# Patient Record
Sex: Male | Born: 1985 | Race: White | Hispanic: No
Health system: Midwestern US, Community
[De-identification: ages and names within clinical notes are randomized; demographics above are authoritative.]

## PROBLEM LIST (undated history)

## (undated) DIAGNOSIS — S069XAA Unspecified intracranial injury with loss of consciousness status unknown, initial encounter: Secondary | ICD-10-CM

## (undated) DIAGNOSIS — G43909 Migraine, unspecified, not intractable, without status migrainosus: Secondary | ICD-10-CM

## (undated) DIAGNOSIS — S069X9A Unspecified intracranial injury with loss of consciousness of unspecified duration, initial encounter: Secondary | ICD-10-CM

## (undated) HISTORY — PX: ANKLE SURGERY: SHX546

---

## 2014-11-05 ENCOUNTER — Inpatient Hospital Stay
Admit: 2014-11-05 | Discharge: 2014-11-05 | Disposition: A | Discharge status: Home / Self Care | Payer: PRIVATE HEALTH INSURANCE | Attending: Emergency Medical Services

## 2014-11-05 DIAGNOSIS — L500 Allergic urticaria: Secondary | ICD-10-CM

## 2014-11-05 LAB — POC CHEM8
BUN: 14 mg/dl (ref 7–25)
BUN: 15 mg/dl (ref 7–25)
CALCIUM,IONIZED: 4.3 mg/dL — ABNORMAL LOW (ref 4.40–5.40)
CALCIUM,IONIZED: 4.3 mg/dL — ABNORMAL LOW (ref 4.40–5.40)
CO2, TOTAL: 19 mmol/L — ABNORMAL LOW (ref 21–32)
CO2, TOTAL: 20 mmol/L — ABNORMAL LOW (ref 21–32)
Chloride: 108 mEq/L — ABNORMAL HIGH (ref 98–107)
Chloride: 109 mEq/L — ABNORMAL HIGH (ref 98–107)
Creatinine: 0.8 mg/dl (ref 0.6–1.3)
Creatinine: 0.9 mg/dl (ref 0.6–1.3)
Glucose: 92 mg/dL (ref 74–106)
Glucose: 89 mg/dL (ref 74–106)
HCT: 35 % — ABNORMAL LOW (ref 40–54)
HCT: 35 % — ABNORMAL LOW (ref 40–54)
HGB: 11.9 gm/dl — ABNORMAL LOW (ref 13.0–17.2)
HGB: 11.9 gm/dl — ABNORMAL LOW (ref 13.0–17.2)
Potassium: 2.9 mEq/L — CL (ref 3.5–4.9)
Potassium: 3 mEq/L — ABNORMAL LOW (ref 3.5–4.9)
Sodium: 145 mEq/L (ref 136–145)
Sodium: 144 mEq/L (ref 136–145)

## 2014-11-05 MED ORDER — POTASSIUM CHLORIDE SR 10 MEQ TAB
10 mEq | ORAL | Status: AC
Start: 2014-11-05 — End: 2014-11-05
  Administered 2014-11-05: 06:00:00 via ORAL

## 2014-11-05 MED FILL — POTASSIUM CHLORIDE SR 10 MEQ TAB: 10 mEq | ORAL | Qty: 4

## 2014-11-05 NOTE — ED Notes (Signed)
Ate dinner at 1730 cheeseburger, oinion rings . Three hours after meal developed abdominal pain then developed hives on back, chest and legs. Given benadryl 50 mg IVP PTA with relief of symptoms.

## 2014-11-05 NOTE — Other (Signed)
2:38 AM  11/05/2014     Discharge instructions given to pt (name) with verbalization of understanding. Patient accompanied by friend.  Patient discharged with the following prescriptions none. Patient discharged to home (destination).      Bynum Bellows, RN

## 2014-11-06 NOTE — ED Provider Notes (Signed)
[facility]  EMERGENCY DEPARTMENT TREATMENT REPORT  NAME:  John Dickson  SEX:   M  ADMIT: 11/05/2014  DOB:   1985/06/19  MR#    [id number]  ROOM:  IB70  TIME DICTATED: 04 28 AM  ACCT#  [id number]        TIME OF EVALUATION:  0147.    CHIEF COMPLAINT:  Allergic reaction.    HISTORY OF PRESENT ILLNESS:  A 29 year old male who presents for allergic reaction.  Approximately 2330 he   suddenly felt very warm with cramping, generalized abdominal pain, generalized   itching with hives to his face, chest and back.  Initially thought symptoms   were an allergic reaction to a cheeseburger and onion rings he had eaten at a   new restaurant, [employer] around 1730 today,  but then he recalled   approximately 30 minutes prior to onset of symptoms, he had eaten [employer] which he had also never eaten.  He works as a Surveyor, minerals living on a   compound where there [employer]or, minerals.  He was given 50 mg Benadryl IV prior to   arrival and presents with an IV in place and a liter of fluids running.    Symptoms have already have completely resolved on arrival.  States he feels   great and wants to be discharged.    REVIEW OF SYSTEMS:  ENT:  No swelling of lips or tongue.  No throat pain, tightening swelling or   discomfort.  RESPIRATORY:  No shortness of breath or difficulty breathing, cough or   wheezing.  ALLERGIC:  No urticaria.  GASTROINTESTINAL:  As above.  No nausea, vomiting or diarrhea.    PAST MEDICAL HISTORY:  Left ankle fracture.    SOCIAL HISTORY:  Former smoker, still dips tobacco, consumes alcohol socially.  Denies   recreational drug use.    ALLERGIES:  PENICILLINS.    MEDICATIONS:  Advil p.r.n.    PHYSICAL EXAMINATION:  VITAL SIGNS:  BP 126/46, pulse 62, respirations 16, temperature 97.5, pain 0,   O2 saturation 98% on room air.  GENERAL APPEARANCE:  Well developed, well nourished, seated comfortably on the   bed.  HEENT:  Eyes:  Conjunctivae clear, lids normal.  Pupils equal, symmetrical,    and normally reactive.  ENT:  No swelling of lips or tongue.  Oral mucosa is pink and moist.  Uvula   midline, nonedematous, nonerythematous.  Posterior pharynx has no swelling.    Airway patent, no stridor.  NECK:  Supple, nontender, no swelling or submandibular masses.  Trachea   midline.  RESPIRATORY:  Lungs clear.  No wheezing, crackles, rales or rhonchi.  No   respiratory distress, tachypnea or accessory muscle use.  CARDIOVASCULAR:  Heart regular, without murmurs, gallops, rubs or thrills.  GASTROINTESTINAL:   Abdomen is soft, nondistended, nontender.  SKIN:  Warm and dry.  There is no rash or urticaria to his person.    DIAGNOSTIC INTERPRETATIONS:  Initial Chem 8:  Potassium 2.9, chloride 108, CO2 total 19, hematocrit 35,   hemoglobin 11.9, ionized calcium 4.3.  Repeat Chem 8:  Potassium 3, chloride   109, total CO2 of 20, hematocrit 35, hemoglobin 11.9, ionized calcium 4.3.    COURSE IN THE EMERGENCY DEPARTMENT:  The patient presents following an allergic reaction.  I do suspect it was the   [employer] that caused his symptoms.   He has had a complete resolution of   the symptoms with the Benadryl he was  given prior to arrival.  He has   remained asymptomatic throughout his ED stay.  No signs or symptoms concerning   for her anaphylaxis.    Prior to my evaluation, the nurse had check an i-STAT chem 8.  Potassium was   low at 2.9, so she repeated another chem 8 to repeat the potassium and it was   still low at 3.0.  Because of this, we did supplement him with 40 mEq of   Klor-Con 10 p.o.  Neither I nor Dr. Cipriano Mile would have ordered a Chem 8 and   both of these were done by the nurse.  Chem 8 also showed that his CO2 was low   which is consistent with dehydration.  The patient admitted he had been   drinking alcohol socially  the past few days.  Therefore, we did hydrate with   a liter of fluids during his ED stay and recommended he encourage fluids at   home.    DIAGNOSES:   1.  Allergic reaction, initial encounter.   2.  Hypokalemia.    DISPOSITION:  The patient discharged home in stable condition with verbal and written   instructions for ongoing care.  He was instructed to avoid Was[employer] popcorn in   the future.  Should encourage fluids at home.  May continue Benadryl 25 to 50   mg every 6 hours as needed for itching or should rash return.  He is aware he   may return to the emergency department at any time for new or worsening   symptoms and should return immediately should he develop difficulty breathing,   shortness of breath, swelling of lips or tongue or any other concerns.      The patient was personally evaluated by myself and Dr. Damien Fusi who   agrees with the above assessment and plan.      ___________________  Elsie Saas MD  Dictated By: Verlin Grills, PA    My signature above authenticates this document and my orders, the final   diagnosis (es), discharge prescription (s), and instructions in the Epic   record.  If you have any questions please contact [phone].    Nursing notes have been reviewed by the physician/ advanced practice   clinician.    BB  D:11/06/2014 04:28:56  T: 11/06/2014 16:04:55  [id number]

## 2016-04-25 ENCOUNTER — Ambulatory Visit: Payer: Self-pay | Admitting: Family Medicine

## 2016-04-26 NOTE — ED Provider Notes (Signed)
 ------------------------------------------------------------------------------- Attestation signed by Odean ONEIDA Hands, MD at 04/28/16 [phone] I have reviewed and agree with the APP's findings and plan for this patient. Odean ONEIDA Hands, MD Emergency Department - 04/28/2016 12:37 AM  -------------------------------------------------------------------------------  [facility]  ED Provider Note  Zachary Eaton 30 y.o. male DOB: 03/01/1986 MRN: [id number] History   Chief Complaint  Patient presents with  . Psychiatric Evaluation    Pt was brought here by [facility] d/t Involuntary Committment - Pt was in [facility] - states  end is coming - according to [facility]   According to the [place] police officer with the patient he was contacted by a doctor at facility who said that he was taking out IVC papers on the patient. The patient has been quoting the Bible and is certain that the end of time is coming. The patient states that this is correct it does feel the end of time is coming. He says that he has seen this in his dreams at night. The patient states he does not hear voices and does not see things that other people don't see. The patient states increased not depressed and has no thoughts of hurting himself or others. Patient states he smokes marijuana intermittently and drinks occasionally but does not use any other drugs and has not had anything to drink today. The patient states that he has never been diagnosed with depression or any other mental disorder and is not currently taking any medications.     History provided by:  Patient Psychiatric Evaluation  Presenting symptoms: delusional and paranoid behavior   Presenting symptoms: no depression, no hallucinations, no homicidal ideas and no suicidal thoughts   Patient accompanied by:  Law enforcement Degree of incapacity (severity):  Moderate Onset quality: The patient states he stopped this way for a long  time. Duration: Unknown. Timing:  Constant Progression:  Unchanged Chronicity:  New Context: alcohol use and drug abuse (marijuana currently and the patient states he tried methamphetamine once a long time ago)   Context: not noncompliant and not stressful life event (the patient denies)   Relieved by:  None tried Worsened by:  Nothing tried Associated symptoms: no abdominal pain, no anxiety, no chest pain, no headaches, no hyperventilation and no irritability   Risk factors: no hx of mental illness (the patient deies) and no hx of suicide attempts     History reviewed. No pertinent past medical history.  Past Surgical History:  Procedure Laterality Date  . Ankle surgery     left    History  Alcohol Use No   History  Smoking Status  . Never Smoker  Smokeless Tobacco  . Current User   History  Drug Use  . Types: Marijuana        No Known Allergies  Home Medications   No medications on file    Review of Systems   Review of Systems  Constitutional: Negative for fever and irritability.  Respiratory: Negative for shortness of breath.   Cardiovascular: Negative for chest pain.  Gastrointestinal: Negative for abdominal pain, nausea and vomiting.  Musculoskeletal: Negative for back pain and neck pain.  Skin: Negative for color change.  Neurological: Negative for headaches.  Psychiatric/Behavioral: Positive for paranoia. Negative for hallucinations, homicidal ideas and suicidal ideas. The patient is not nervous/anxious.     Physical Exam   ED Triage Vitals [04/26/16 1916]  BP (!) 164/92  Heart Rate 102  Resp 18  SpO2 99 %  Temp 98.7 F (37.1  C)    Physical Exam  Constitutional: He is oriented to person, place, and time. He appears well-developed and well-nourished.  The patient is a well-nourished well-developed 30 year old male in no acute distress. The patient is alert and oriented 3 and does not appear to be ill or toxic.  HENT:  Head: Normocephalic  and atraumatic.  Mouth/Throat: Oropharynx is clear and moist.  Eyes: EOM are normal.  Neck: Normal range of motion.  Cardiovascular: Normal rate, normal heart sounds and intact distal pulses.   Pulmonary/Chest: Effort normal and breath sounds normal. No respiratory distress. He has no wheezes. He has no rales.  Musculoskeletal: Normal range of motion.  Neurological: He is alert and oriented to person, place, and time.  Skin: Skin is warm and dry.  Psychiatric: His speech is normal. Judgment normal. His mood appears not anxious. He is not agitated, not aggressive, not hyperactive and not withdrawn. Thought content is paranoid and delusional. Cognition and memory are normal. He does not exhibit a depressed mood. He expresses no homicidal and no suicidal ideation. He expresses no suicidal plans and no homicidal plans.  Vitals reviewed.   ED Course   Lab results:    SALICYLATE LEVEL - Abnormal       Result Value   Salicylate 10.3 (*)   URINE DRUGS OF ABUSE SCRN - Abnormal    Cannab Scr Positive (*)    Ur PH DOA Scr 5.5     Amphet Scr Negative     Barb Scr Negative     Benzo Scr Negative     Cocaine Scr Negative     Opiates Scr Negative     Meth Scr Negative     Oxyco Scr Negative     Narrative:    Please Note Detection Levels Below:                            Amphetamines                    1000 ng/mL  Barbiturates                    200 ng/mL  Benzodiazepines                 200 ng/mL  Cannabinoids (Marijuana, THC)   50 ng/mL  Cocaine                         300 ng/mL  Opiates                         300 ng/mL  Methadone                       300 ng/mL  Oxycodone                       100 ng/mL  This test is a screening test and results are only to be used for medical purposes.  If confirmation of positive results are needed, please order confirmation by GC/MS for each drug that needs confirmation.  Urine specimens are retained for 5 days.  URINALYSIS WITH MICROSCOPIC  - Abnormal    Urine Ketones 40  (*)    Urine Color Yellow     Urine Appearance Clear     Urine Specific Gravity 1.007  Urine pH 5.5     Urine Protein - Dipstick Negative     Urine Glucose Negative     Urine Bilirubin Negative     Urine Blood Negative     Urine Nitrite Negative     Urine Urobilinogen 0.2      Urine Leukocyte Esterase Negative    CBC AND DIFFERENTIAL - Abnormal    WBC 14.1 (*)    NEUTROPHIL % 81.4 (*)    LYMPHOCYTE % 13.6 (*)    Eosinophil % 0.1 (*)    ABSOLUTE NEUTROPHIL COUNT 11.46 (*)    RBC 5.34     HGB 16.0     HCT 46.8     MCV 88     MCH 30.0     MCHC 34.2     Plt Ct 335     RDW SD 39.2     MPV 10.3     NRBC% 0.0     NRBC 0.000     MONOCYTE % 4.8     BASOPHIL % 0.1     ABSOLUTE LYMPHOCYTE COUNT 1.9     MONO ABSOLUTE 0.7     EOS ABSOLUTE 0.0     BASO ABSOLUTE 0.0    COMPREHENSIVE METABOLIC PANEL - Abnormal    Cl 96 (*)    Glucose 106 (*)    BUN/CREAT RATIO 7.8 (*)    AGAP 17 (*)    Na 137     Potassium 4.0     CO2 24     BUN 7     Creatinine 0.90     Ca 9.7     ALK PHOS 41     T Bili 0.84     Total Protein 8.5     Alb 5.5     GLOBULIN 3.0     ALBUMIN/GLOBULIN RATIO 1.8     ALT 20     AST 27     GFR AFRICAN AMERICAN 134     Comment:  African-American:  Normal GFR (glomerular filtration rate) > 60 mL/min/1.73 meters squared. < 60 may include impaired kidney function based on creatinine, age, gender, and race normalized to accepted average body surface area    GFR Non African American 116     Comment:  Non African American:  Normal GFR (glomerular filtration rate) > 60 mL/min/1.73 meters squared. < 60 may include impaired kidney function based on creatinine, age, gender, and race normalized to accepted average body surface area.   ACETAMINOPHEN  LEVEL - Normal   Acetaminophen  <15.0    ETHANOL - Normal   Ethanol <10     Comment: Blood Alcohol Level is for Medical Purposes Only.    Imaging: No data to display ECG: ECG Results    None     Pre-Sedation Procedures  ED Course    MDM Number of Diagnoses or Management Options Delusional disorder (*):  Marijuana abuse:  Paranoid (*):  Diagnosis management comments: Discussed behavioral health and they will evaluate the patient.    Amount and/or Complexity of Data Reviewed Clinical lab tests: ordered and reviewed   Coding  New Prescriptions   No medications on file    Clinical Impression   Final diagnoses:  Delusional disorder (*)  Paranoid (*)  Marijuana abuse    ED Disposition    ED Disposition Comment   Behavioral Health         note: Voice recognition software was used in the creation of this document. Unintended dictation in all areas  may have escaped editorial review.   Electronically signed by:   Glean Birmingham, PA 04/26/16 2156
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
## 2016-11-26 NOTE — ED Provider Notes (Signed)
-------------------------------------------------------------------------------   Attestation signed by Parnell Donnice SQUIBB, MD at 11/26/2016  6:32 PM Please see my note for remainder of care. -------------------------------------------------------------------------------  10:26 AM LACERATION REPAIR (simple) -  Time out performed and patient identified yes Site marking completed Yes The benefits of the procedure were carefully explained to the patient.  The potential risks and complications of the procedure were also discussed and the patient indicated a clear understanding.  The patient gave verbal consent without reservation.    All wound(s) areas needing sutures were prepped with hibiclens. Local anesthetic was infiltrated using LET, and the woundwas  irrigated with copious amounts of normal saline.  Laceration was 1.5cm in length, located on right forehead, simple, and full thickness(skin only) .  Wound did not require removal of particulate matter.  Exploration of wound showed no foreign bodies. The wound was closed with 2 sutures of 6-0 prolene.  There were 1 packs of sutures opened. Good wound closure and hemostasis was noted. Patient tolerated the procedure well with no known complications. A band aid was applied and the patient was given verbal and written post procedural instructions.  This was the only participation and intervention I was involved with concerning this patients care while at our facility.
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
## 2017-07-17 ENCOUNTER — Other Ambulatory Visit: Payer: Self-pay

## 2017-07-17 ENCOUNTER — Encounter (HOSPITAL_BASED_OUTPATIENT_CLINIC_OR_DEPARTMENT_OTHER): Payer: Self-pay

## 2017-07-17 ENCOUNTER — Emergency Department (HOSPITAL_BASED_OUTPATIENT_CLINIC_OR_DEPARTMENT_OTHER)
Admission: EM | Admit: 2017-07-17 | Discharge: 2017-07-17 | Disposition: A | Discharge status: Home / Self Care | Payer: Self-pay | Attending: Emergency Medicine | Admitting: Emergency Medicine

## 2017-07-17 DIAGNOSIS — R519 Headache, unspecified: Secondary | ICD-10-CM

## 2017-07-17 DIAGNOSIS — F17290 Nicotine dependence, other tobacco product, uncomplicated: Secondary | ICD-10-CM | POA: Insufficient documentation

## 2017-07-17 DIAGNOSIS — R51 Headache: Secondary | ICD-10-CM | POA: Insufficient documentation

## 2017-07-17 HISTORY — DX: Migraine, unspecified, not intractable, without status migrainosus: G43.909

## 2017-07-17 LAB — BASIC METABOLIC PANEL
ANION GAP: 8 (ref 5–15)
BUN: 11 mg/dL (ref 6–20)
CHLORIDE: 104 mmol/L (ref 101–111)
CO2: 27 mmol/L (ref 22–32)
Calcium: 9.2 mg/dL (ref 8.9–10.3)
Creatinine, Ser: 0.8 mg/dL (ref 0.61–1.24)
GFR calc Af Amer: >60 mL/min (ref >60)
GFR calc non Af Amer: >60 mL/min (ref >60)
GLUCOSE: 95 mg/dL (ref 65–99)
Potassium: 3.7 mmol/L (ref 3.5–5.1)
Sodium: 139 mmol/L (ref 135–145)

## 2017-07-17 LAB — CBC
HCT: 40.6 % (ref 39.0–52.0)
HEMOGLOBIN: 14 g/dL (ref 13.0–17.0)
MCH: 30.1 pg (ref 26.0–34.0)
MCHC: 34.5 g/dL (ref 30.0–36.0)
MCV: 87.3 fL (ref 78.0–100.0)
Platelets: 205 10*3/uL (ref 150–400)
RBC: 4.65 MIL/uL (ref 4.22–5.81)
RDW: 12.4 % (ref 11.5–15.5)
WBC: 12.6 10*3/uL — ABNORMAL HIGH (ref 4.0–10.5)

## 2017-07-17 MED ORDER — SODIUM CHLORIDE 0.9 % IV BOLUS (SEPSIS)
1000.0000 mL | Freq: Once | INTRAVENOUS | Status: AC
  Administered 2017-07-17: 1000 mL via INTRAVENOUS

## 2017-07-17 MED ORDER — PROCHLORPERAZINE EDISYLATE 5 MG/ML IJ SOLN
10.0000 mg | Freq: Once | INTRAMUSCULAR | Status: AC
  Administered 2017-07-17: 10 mg via INTRAVENOUS
  Filled 2017-07-17: qty 2

## 2017-07-17 MED ORDER — DIPHENHYDRAMINE HCL 50 MG/ML IJ SOLN
25.0000 mg | Freq: Once | INTRAMUSCULAR | Status: AC
  Administered 2017-07-17: 25 mg via INTRAVENOUS
  Filled 2017-07-17: qty 1

## 2017-07-17 MED ORDER — KETOROLAC TROMETHAMINE 15 MG/ML IJ SOLN
15.0000 mg | Freq: Once | INTRAMUSCULAR | Status: AC
  Administered 2017-07-17: 15 mg via INTRAVENOUS
  Filled 2017-07-17: qty 1

## 2017-07-17 NOTE — ED Provider Notes (Signed)
[facility] EMERGENCY DEPARTMENT Provider Note   CSN: [id number] Arrival date & time: 07/17/17  1652     History   Chief Complaint Chief Complaint  Patient presents with  . Headache    HPI Zachary Eaton is a 32 y.o. male.  HPI  Zachary Eaton is a 32 year old male with a history of migraines who presents to the emergency department for evaluation of migraine headache.  Patient states that his headache began yesterday and has been persistent.  Headache is located behind the left eye and radiates to the left parietal region.  He states that his pain is 7/10 in severity and describes it as "sharp pain."  It is worsened with loud noises or bright light. He states he feels improved with laying still in a dark room.  He has associated nausea with one episode of vomiting today.  Tried taking ibuprofen for his symptoms without significant relief.  States that this feels typical of his migraine headaches given it is on one side and began with blurred vision or aura.  Denies blurred vision currently.  Denies fevers, chills, neck pain/stiffness, numbness, weakness, chest pain, shortness of breath, abdominal pain.  Denies worst headache of his life, hx of cancer or thunderclap headache.   Past Medical History:  Diagnosis Date  . Migraines     There are no active problems to display for this patient.   Past Surgical History:  Procedure Laterality Date  . ANKLE SURGERY Left        Home Medications    Prior to Admission medications   Not on File    Family History No family history on file.  Social History Social History   Tobacco Use  . Smoking status: Never Smoker  . Smokeless tobacco: Current User  Substance Use Topics  . Alcohol use: Yes    Comment: occ  . Drug use: No     Allergies   Penicillins   Review of Systems Review of Systems  Constitutional: Negative for chills and fever.  HENT: Negative for facial swelling and trouble swallowing.     Eyes: Positive for photophobia. Negative for visual disturbance.  Respiratory: Negative for shortness of breath.   Cardiovascular: Negative for chest pain.  Gastrointestinal: Positive for nausea and vomiting. Negative for abdominal pain.  Genitourinary: Negative for difficulty urinating.  Musculoskeletal: Negative for gait problem, neck pain and neck stiffness.  Skin: Negative for rash.  Neurological: Positive for headaches. Negative for dizziness, weakness, light-headedness and numbness.  Psychiatric/Behavioral: Negative for agitation.     Physical Exam Updated Vital Signs BP (!) 143/87 (BP Location: Right Arm)   Pulse 77   Temp 98.8 F (37.1 C)   Ht 6' (1.829 m)   Wt 77.1 kg (170 lb)   SpO2 99%   BMI 23.06 kg/m   Physical Exam  Constitutional: He is oriented to person, place, and time. He appears well-developed and well-nourished. No distress.  HENT:  Head: Normocephalic and atraumatic.  Mouth/Throat: Oropharynx is clear and moist. No oropharyngeal exudate.  No tenderness or erythema over temporal arteries.   Eyes: Conjunctivae and EOM are normal. Pupils are equal, round, and reactive to light. Right eye exhibits no discharge. Left eye exhibits no discharge.  Neck: Normal range of motion. Neck supple.  Cardiovascular: Normal rate, regular rhythm and intact distal pulses. Exam reveals no friction rub.  No murmur heard. Pulmonary/Chest: Effort normal and breath sounds normal. No stridor. No respiratory distress. He has no wheezes. He has no  rales.  Abdominal: Soft. There is no tenderness. There is no guarding.  Musculoskeletal: Normal range of motion.  Neurological: He is alert and oriented to person, place, and time. Coordination normal.  Mental Status:  Alert, oriented, thought content appropriate, able to give a coherent history. Speech fluent without evidence of aphasia. Able to follow 2 step commands without difficulty.  Cranial Nerves:  II:  Peripheral visual fields  grossly normal, pupils equal, round, reactive to light III,IV, VI: ptosis not present, extra-ocular motions intact bilaterally  V,VII: smile symmetric, facial light touch sensation equal VIII: hearing grossly normal to voice  X: uvula elevates symmetrically  XI: bilateral shoulder shrug symmetric and strong XII: midline tongue extension without fassiculations Motor:  Normal tone. 5/5 in upper and lower extremities bilaterally including strong and equal grip strength and dorsiflexion/plantar flexion Sensory: Pinprick and light touch normal in all extremities.  Deep Tendon Reflexes: 2+ and symmetric in the biceps and patella Cerebellar: normal finger-to-nose with bilateral upper extremities Gait: normal gait and balance CV: distal pulses palpable throughout   Skin: Skin is warm and dry. Capillary refill takes less than 2 seconds. He is not diaphoretic.  Psychiatric: He has a normal mood and affect. His behavior is normal.  Nursing note and vitals reviewed.    ED Treatments / Results  Labs (all labs ordered are listed, but only abnormal results are displayed) Labs Reviewed  BASIC METABOLIC PANEL  CBC    EKG  EKG Interpretation None       Radiology No results found.  Procedures Procedures (including critical care time)  Medications Ordered in ED Medications  sodium chloride 0.9 % bolus 1,000 mL (1,000 mLs Intravenous New Bag/Given 07/17/17 1746)  ketorolac (TORADOL) 15 MG/ML injection 15 mg (15 mg Intravenous Given 07/17/17 1747)  prochlorperazine (COMPAZINE) injection 10 mg (10 mg Intravenous Given 07/17/17 1747)  diphenhydrAMINE (BENADRYL) injection 25 mg (25 mg Intravenous Given 07/17/17 1747)     Initial Impression / Assessment and Plan / ED Course  I have reviewed the triage vital signs and the nursing notes.  Pertinent labs & imaging results that were available during my care of the patient were reviewed by me and considered in my medical decision making (see chart for  details).     Pt HA treated and improved while in ED.  Presentation is like pts typical HA and non concerning for [facility], ICH, Meningitis, or temporal arteritis. Pt is afebrile with no focal neuro deficits, nuchal rigidity, or change in vision. Pt is to follow up with PCP to discuss prophylactic medication. Pt verbalizes understanding and is agreeable with plan to dc.   Final Clinical Impressions(s) / ED Diagnoses   Final diagnoses:  Acute nonintractable headache, unspecified headache type    ED Discharge Orders    None       Kellie ShropshireShrosbree, Bitania Shankland J, PA-C 07/17/17 1842    Arby BarrettePfeiffer, Marcy, MD 07/21/17 [phone]

## 2017-07-17 NOTE — ED Notes (Signed)
Pt states his head feels better

## 2017-07-17 NOTE — Discharge Instructions (Signed)
[facility]   Your blood pressure was mildly elevated today, [facility]

## 2017-07-17 NOTE — ED Triage Notes (Signed)
Pt c/o migraine HA x 2 days.

## 2017-11-11 ENCOUNTER — Emergency Department (HOSPITAL_BASED_OUTPATIENT_CLINIC_OR_DEPARTMENT_OTHER)
Admission: EM | Admit: 2017-11-11 | Discharge: 2017-11-11 | Disposition: A | Discharge status: Home / Self Care | Payer: Self-pay | Attending: Emergency Medicine | Admitting: Emergency Medicine

## 2017-11-11 ENCOUNTER — Other Ambulatory Visit: Payer: Self-pay

## 2017-11-11 ENCOUNTER — Emergency Department (HOSPITAL_BASED_OUTPATIENT_CLINIC_OR_DEPARTMENT_OTHER): Payer: Self-pay

## 2017-11-11 ENCOUNTER — Encounter (HOSPITAL_BASED_OUTPATIENT_CLINIC_OR_DEPARTMENT_OTHER): Payer: Self-pay | Admitting: *Deleted

## 2017-11-11 DIAGNOSIS — W25XXXA Contact with sharp glass, initial encounter: Secondary | ICD-10-CM | POA: Insufficient documentation

## 2017-11-11 DIAGNOSIS — Y999 Unspecified external cause status: Secondary | ICD-10-CM | POA: Insufficient documentation

## 2017-11-11 DIAGNOSIS — S51811A Laceration without foreign body of right forearm, initial encounter: Secondary | ICD-10-CM | POA: Insufficient documentation

## 2017-11-11 DIAGNOSIS — Y9389 Activity, other specified: Secondary | ICD-10-CM | POA: Insufficient documentation

## 2017-11-11 DIAGNOSIS — Y929 Unspecified place or not applicable: Secondary | ICD-10-CM | POA: Insufficient documentation

## 2017-11-11 MED ORDER — LIDOCAINE-EPINEPHRINE (PF) 2 %-1:200000 IJ SOLN
20.0000 mL | Freq: Once | INTRAMUSCULAR | Status: DC
  Filled 2017-11-11: qty 20

## 2017-11-11 MED ORDER — LIDOCAINE-EPINEPHRINE (PF) 2 %-1:200000 IJ SOLN
INTRAMUSCULAR | Status: AC
  Filled 2017-11-11: qty 10

## 2017-11-11 MED ORDER — BACITRACIN ZINC 500 UNIT/GM EX OINT: 1.0000 "application " | TOPICAL_OINTMENT | Freq: Two times a day (BID) | CUTANEOUS | 0 refills | Status: DC

## 2017-11-11 MED ORDER — BACITRACIN ZINC 500 UNIT/GM EX OINT
TOPICAL_OINTMENT | Freq: Two times a day (BID) | CUTANEOUS | Status: DC
  Administered 2017-11-11: 1 via TOPICAL

## 2017-11-11 NOTE — ED Provider Notes (Signed)
[facility] EMERGENCY DEPARTMENT Provider Note   CSN: [id number] Arrival date & time: 11/11/17  0028     History   Chief Complaint Chief Complaint  Patient presents with  . Laceration    HPI MALIQ PILLEY is a 32 y.o. male.  HPI  32 year old male comes in with chief complaint of laceration to his right hand. Patient states that he got upset and hit a glass object.  Patient ended up with multiple small lacerations to both of his hands, but he has a large laceration to the right distal forearm.  Patient is right-handed.  He denies any numbness, tingling.  No injury elsewhere.  Patient is up-to-date with his tetanus.  Past Medical History:  Diagnosis Date  . Migraines     There are no active problems to display for this patient.   Past Surgical History:  Procedure Laterality Date  . ANKLE SURGERY Left         Home Medications    Prior to Admission medications   Medication Sig Start Date End Date Taking? Authorizing Provider  bacitracin ointment Apply 1 application topically 2 (two) times daily. 11/11/17   Derwood Kaplan, MD    Family History History reviewed. No pertinent family history.  Social History Social History   Tobacco Use  . Smoking status: Never Smoker  . Smokeless tobacco: Current User  Substance Use Topics  . Alcohol use: Yes    Comment: occ  . Drug use: No     Allergies   Penicillins   Review of Systems Review of Systems  Constitutional: Positive for activity change.  Musculoskeletal: Positive for arthralgias.  Skin: Positive for wound.  Allergic/Immunologic: Negative for immunocompromised state.  Hematological: Does not bruise/bleed easily.     Physical Exam Updated Vital Signs BP 130/72 (BP Location: Left Arm)   Pulse 80   Temp 98.2 F (36.8 C) (Oral)   Resp 18   Ht 5\' 11"  (1.803 m)   Wt 77.1 kg (170 lb)   SpO2 99%   BMI 23.71 kg/m   Physical Exam  Constitutional: He is oriented to person, place,  and time. He appears well-developed.  HENT:  Head: Atraumatic.  Neck: Neck supple.  Cardiovascular: Normal rate.  Pulmonary/Chest: Effort normal.  Musculoskeletal: He exhibits tenderness.  Patient has tenderness to palpation of the right ulnar styloid.   Neurological: He is alert and oriented to person, place, and time.  Range of motion of the wrist is normal.  Patient is able to make a fist, oppose and flex all of his digits, given okay sign, oppose his thumb.  There is no gross sensory deficit  Skin: Skin is warm.  4 cm laceration distal right forearm.  Nursing note and vitals reviewed.    ED Treatments / Results  Labs (all labs ordered are listed, but only abnormal results are displayed) Labs Reviewed - No data to display  EKG None  Radiology Dg Hand Complete Right  Result Date: 11/11/2017 CLINICAL DATA:  Lacerations to right hand EXAM: RIGHT HAND - COMPLETE 3+ VIEW COMPARISON:  None. FINDINGS: There is no evidence of fracture or dislocation. There is no evidence of arthropathy or other focal bone abnormality. Soft tissues are unremarkable. No radiopaque foreign body. IMPRESSION: No radiopaque foreign body or osseous injury. Electronically Signed   By: Deatra Robinson M.D.   On: 11/11/2017 01:38    Procedures .Marland KitchenLaceration Repair Date/Time: 11/11/2017 9:27 AM Performed by: Derwood Kaplan, MD Authorized by: Derwood Kaplan, MD  Consent:    Consent obtained:  Verbal   Consent given by:  Patient   Risks discussed:  Pain, infection, poor cosmetic result, need for additional repair, poor wound healing and nerve damage Universal protocol:    Procedure explained and questions answered to patient or proxy's satisfaction: yes     Imaging studies available: yes     Site/side marked: yes     Immediately prior to procedure, a time out was called: yes     Patient identity confirmed:  Arm band Anesthesia (see MAR for exact dosages):    Anesthesia method:  Local infiltration    Local anesthetic:  Lidocaine 1% WITH epi Laceration details:    Location:  Hand   Hand location:  R wrist   Length (cm):  4   Depth (mm):  2 Repair type:    Repair type:  Simple Pre-procedure details:    Preparation:  Patient was prepped and draped in usual sterile fashion Exploration:    Wound extent: no foreign bodies/material noted, no muscle damage noted, no nerve damage noted, no tendon damage noted, no underlying fracture noted and no vascular damage noted     Contaminated: yes   Treatment:    Area cleansed with:  Saline   Amount of cleaning:  Standard   Irrigation solution:  Sterile saline and tap water   Irrigation volume:  100   Irrigation method:  Syringe Skin repair:    Repair method:  Sutures   Suture size:  4-0   Suture material:  Nylon   Suture technique:  Simple interrupted   Number of sutures:  6 Approximation:    Approximation:  Close Post-procedure details:    Dressing:  Antibiotic ointment   Patient tolerance of procedure:  Tolerated well, no immediate complications   (including critical care time)  Medications Ordered in ED Medications - No data to display   Initial Impression / Assessment and Plan / ED Course  I have reviewed the triage vital signs and the nursing notes.  Pertinent labs & imaging results that were available during my care of the patient were reviewed by me and considered in my medical decision making (see chart for details).     32 year old young man, healthy, up-to-date with tetanus comes in with chief complaint of laceration.  Laceration sustained due to glass injury.  X-ray does not show any fractures, there is no foreign body visualized.  Laceration repaired without any complications.  Wound care precautions discussed.  Final Clinical Impressions(s) / ED Diagnoses   Final diagnoses:  Laceration of right forearm, initial encounter    ED Discharge Orders        Ordered    bacitracin ointment  2 times daily     11/11/17  0415       Derwood KaplanNanavati, Kataleya Zaugg, MD 11/11/17 [phone]

## 2017-11-11 NOTE — Discharge Instructions (Signed)
We saw you in the ER for your WOUND. °Please read the instructions provided on wound care. °Keep the area clean and dry, apply bacitracin ointment daily and take the medications provided. °RETURN TO THE ER IF THERE IS INCREASED PAIN, REDNESS, PUS COMING OUT from the wound site.  °

## 2017-11-11 NOTE — ED Notes (Signed)
ED Provider at bedside. 

## 2017-11-11 NOTE — ED Notes (Signed)
ED Provider at bedside suturing.  

## 2017-11-11 NOTE — ED Notes (Signed)
Pt given d/c instructions as per chart. Rx x 1. Verbalizes understanding. No questions. 

## 2017-11-11 NOTE — ED Triage Notes (Signed)
Pt states he hit a glass with his right hand. Approx 1 in lac noted to posterior aspect of right hand. Multiple smaller superficial lacs noted as well. Bleeding controlled. Moves fingers. Feels touch. Cap refill < 3 sec.

## 2017-11-11 NOTE — ED Notes (Signed)
Attempted to sign. Pad not working.

## 2017-11-11 NOTE — ED Notes (Signed)
Pt triaged by this RN 

## 2018-06-11 ENCOUNTER — Other Ambulatory Visit: Payer: Self-pay

## 2018-06-11 ENCOUNTER — Encounter (HOSPITAL_COMMUNITY): Payer: Self-pay | Admitting: Emergency Medicine

## 2018-06-11 ENCOUNTER — Emergency Department (HOSPITAL_COMMUNITY)
Admission: EM | Admit: 2018-06-11 | Discharge: 2018-06-12 | Disposition: A | Discharge status: Other Acute Inpatient Hospital | Payer: Self-pay | Attending: Emergency Medicine | Admitting: Emergency Medicine

## 2018-06-11 DIAGNOSIS — R45850 Homicidal ideations: Secondary | ICD-10-CM | POA: Insufficient documentation

## 2018-06-11 DIAGNOSIS — F600 Paranoid personality disorder: Secondary | ICD-10-CM | POA: Insufficient documentation

## 2018-06-11 HISTORY — DX: Unspecified intracranial injury with loss of consciousness of unspecified duration, initial encounter: S06.9X9A

## 2018-06-11 HISTORY — DX: Unspecified intracranial injury with loss of consciousness status unknown, initial encounter: S06.9XAA

## 2018-06-11 LAB — RAPID URINE DRUG SCREEN, HOSP PERFORMED
Amphetamines: NOT DETECTED
BARBITURATES: NOT DETECTED
Benzodiazepines: NOT DETECTED
Cocaine: NOT DETECTED
OPIATES: NOT DETECTED
TETRAHYDROCANNABINOL: NOT DETECTED

## 2018-06-11 LAB — CBC
HCT: 42.1 % (ref 39.0–52.0)
Hemoglobin: 13.9 g/dL (ref 13.0–17.0)
MCH: 29.4 pg (ref 26.0–34.0)
MCHC: 33 g/dL (ref 30.0–36.0)
MCV: 89 fL (ref 80.0–100.0)
PLATELETS: 251 10*3/uL (ref 150–400)
RBC: 4.73 MIL/uL (ref 4.22–5.81)
RDW: 11.6 % (ref 11.5–15.5)
WBC: 8.9 10*3/uL (ref 4.0–10.5)
nRBC: 0 % (ref 0.0–0.2)

## 2018-06-11 NOTE — BH Assessment (Signed)
Tele Assessment Note   Patient Name: Zachary Eaton MRN: [id number] Referring Physician: Dr. Bebe Shaggy Location of Patient: [facility]  Location of Provider: [facility]  Zachary Eaton is an 33 y.o., divorced male. Pt presented to [facility] involuntarily, brought by [facility] Department. Pt placed under IVC by his mother. Per paperwork, pt was acting erratically, made threats to "take care" of someone, and was in possession of a firearm. Pt denied making threats or statements. Pt stated that he had an "angry episode." Pt reports that he was "triggered by someone," which pt later identified as his father. Pt stated that he was at home, his father arrived, something transpired and he got angry, so he left. Pt denies having a weapon or making threats. Pt was very vague in his answers and pt verbally stated that he did not want clinician to contact his mother or father as collateral. Pt's father's name is Zachary Eaton. Pt denied all depression and anxiety symptoms. Pt stated that he has experienced hallucinations in the past, but refused to elaborate. Pt stated, "I sense things. Potential danger." Pt stated that he does not sense anything currently. Pt admits to an inpatient hospitalization in December 2018 at [facility] due to his report of a Bipolar Disorder diagnosis. Pt stated, "They tried to put PTSD on me, but I won't take it." Pt denied current MH medications and therapy. Pt denied SI/HI/AH/VH/SA.   Pt reports that he is divorced, has no children and lives with his parents. 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 Pt reports receiving disability income from the [employer]. Pt reports no legal hx or probation. Pt reports having access to weapons that are registered to him. Pt refused to give information so that the weapons can be secured. Pt denied major medical issues. Pt denied hx of trauma.   Pt oriented to person, place, time and situation. Pt presented alert,  dressed appropriately and groomed. Pt spoke clearly, coherently and did not seem to be under the influence of any substances. Pt made good eye contact and answered questions appropriately. Pt presented guarded and irritable, but calm. Pt was not open to the assessment process and gave very vague answers. Pt presented with no impairments of remote or recent memory that could be detected.   The only collateral available is the IVC paperwork completed by pt mother.   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 Migraines   . Traumatic brain injury [facility])     Past Surgical History:  Procedure Laterality Date  . ANKLE SURGERY Left     Family History: No family history on file.  Social History:  reports that he has never smoked. He uses smokeless tobacco. He reports current alcohol use. He reports that he does not use drugs.  Additional Social History:  Alcohol / Drug Use Pain Medications: SEE MAR.  Prescriptions: Pt denied current MH  medications.  Over the Counter: SEE MAR.  History of alcohol / drug use?: No history of alcohol / drug abuse Longest period of sobriety (when/how long): Pt reports using alcohol socially and marijuana in the distant past.   CIWA: CIWA-Ar BP: (!) 144/86 Pulse Rate: 79 COWS:    Allergies:  Allergies  Allergen Reactions  . Penicillins     Unknown reaction    Home Medications: (Not in a hospital admission)   OB/GYN Status:  No LMP for male patient.  General Assessment Data  [facility] of Assessment: [facility] TTS Assessment: In system Is this a Tele or Face-to-Face Assessment?: Tele Assessment Is this an Initial Assessment or a Re-assessment for this encounter?: Initial Assessment Patient Accompanied by:: Other(Pt brought to hosptial by law enforcement. ) Language Other than English: No Living Arrangements: Other (Comment)(Pt reports living with his mother and father. ) What  gender do you identify as?: Male Marital status: Divorced Zachary Eaton name: N/A Pregnancy Status: No Living Arrangements: Parent(Pt reports living with his parents. ) Can pt return to current living arrangement?: (Unsure due to pt episode. ) Admission Status: Involuntary Petitioner: Family member Is patient capable of signing voluntary admission?: Yes Referral Source: Self/Family/Friend Insurance type: [employer], civil (consulting) Exam [facility] Walk-in ONLY) Medical Exam completed: Yes  Crisis Care Plan Living Arrangements: Parent(Pt reports living with his parents. 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 : No Suicidal Intent: No Has patient had any suicidal intent within the past 6 months prior to admission? : No Is patient at risk for suicide?: No Suicidal Plan?: No Has patient had any suicidal plan within the past 6 months prior to admission? : No Access to Means: No What has been your use of drugs/alcohol within the last 12 months?: Pt denied.  Previous Attempts/Gestures: No How many times?: 0 Other Self Harm Risks: Denied Triggers for Past Attempts: None known Intentional Self Injurious Behavior: None Family Suicide History: No Recent stressful life event(s): Conflict (Comment)(Pt reports conflict with father. ) Persecutory voices/beliefs?: Yes Depression: No Depression Symptoms: Feeling angry/irritable Substance abuse history and/or treatment for substance abuse?: No Suicide prevention information given to non-admitted patients: Not applicable  Risk to Others within the past 6  months Homicidal Ideation: No-Not Currently/Within Last 6 Months Does patient have any lifetime risk of violence toward others beyond the six months prior to admission? : Yes (comment) Thoughts of Harm to Others: Yes-Currently Present Comment - Thoughts of Harm to Others: Pt reports that he had thoughts of harming his father before coming to the hospital.  Current Homicidal Intent: No Current Homicidal Plan: No Access to Homicidal Means: No Identified Victim: Father- Zachary Eaton  History of harm to others?: No Assessment of Violence: On admission Violent Behavior Description: Pt brought in under IVC due to altercation fo some kind.  Does patient have access to weapons?: Yes (Comment) Criminal Charges Pending?: No Does patient have a court date: No Is patient on probation?: No  Psychosis Hallucinations: None noted Delusions: Unspecified(Possible feelings of danger that are not present. )  Mental Status Report Appearance/Hygiene: In scrubs, Unremarkable Eye Contact: Good Motor Activity: Freedom of movement Speech: Logical/coherent Level of Consciousness: Alert Mood: Irritable Affect: Apprehensive Anxiety Level: Minimal Thought Processes: Coherent, Relevant Judgement: Impaired Orientation: Person, Place, Time, Situation, Appropriate for developmental age Obsessive Compulsive Thoughts/Behaviors: None  Cognitive Functioning Concentration: Normal Memory: Recent Intact, Remote Intact Is patient IDD: No Insight: Fair Impulse Control: Poor Appetite: Fair Have you had any weight changes? : No Change Sleep: No Change Total Hours of Sleep: 6 Vegetative Symptoms: None  ADLScreening [facility]) Patient's cognitive ability adequate to safely complete daily activities?: Yes Patient able to  express need for assistance with ADLs?: Yes Independently performs ADLs?: Yes (appropriate for developmental age)  Prior Inpatient Therapy Prior Inpatient Therapy:  Yes Prior Therapy Dates: 2018 Prior [facility]ilty[facility]Provider([facility]  Reason for Treatment: Bipolar Disorder   Prior Outpatient Therapy Prior Outpatient Therapy: No Does patient have an ACCT team?: No Does patient have Intensive In-House Services?  : No Does patient have [facility] services? : No Does patient have P4CC services?: No  ADL Screening (condition at time of admission) Patient's cognitive ability adequate to safely complete daily activities?: Yes Is the patient deaf or have difficulty hearing?: No Does the patient have difficulty seeing, even when wearing glasses/contacts?: No Does the patient have difficulty concentrating, remembering, or making decisions?: No Patient able to express need for assistance with ADLs?: Yes Does the patient have difficulty dressing or bathing?: No Independently performs ADLs?: Yes (appropriate for developmental age) Does the patient have difficulty walking or climbing stairs?: No Weakness of Legs: None Weakness of Arms/Hands: None  Home Assistive Devices/Equipment Home Assistive Devices/Equipment: None  Therapy Consults (therapy consults require a physician order) PT Evaluation Needed: No OT Evalulation Needed: No SLP Evaluation Needed: No Abuse/Neglect Assessment (Assessment to be complete while patient is alone) Abuse/Neglect Assessment Can Be Completed: Yes Physical Abuse: Denies Verbal Abuse: Denies Sexual Abuse: Denies Exploitation of patient/patient's resources: Denies Self-Neglect: Denies Values / Beliefs Cultural Requests During Hospitalization: None Spiritual Requests During Hospitalization: None Consults Spiritual Care Consult Needed: No Social Work Consult Needed: No Merchant navy officerAdvance Directives (For Healthcare) Does Patient Have a Medical Advance Directive?: No Would patient like information on creating a medical advance directive?: No - Patient declined          Disposition: Per Donell SievertSpencer Simon, PA; Pt meets  criteria for inpatient treatment due to IVC. Selena BattenKim, [facility] informed of pt disposition.  Disposition Initial Assessment Completed for this Encounter: Yes Patient referred to: [facility](AC Kim informed of pt disposition. )  This service was provided via telemedicine using a 2-way, interactive audio and video technology.  Names of all persons participating in this telemedicine service and their role in this encounter. Name: Suanne MarkerJoshua Schnoebelen Role: Patient   Name: Levora Dredgeorporal Martin  Role: [facility]  Name: Chesley NoonMiriam Haward Pope  Role: Clinician   Name:  Role:    Chesley NoonMiriam Jilleen Essner, M.S., [facility], LCAS Triage Specialist [facility] 06/11/2018 11:34 PM

## 2018-06-11 NOTE — ED Notes (Signed)
TTS in progress 

## 2018-06-11 NOTE — ED Provider Notes (Signed)
[facility] Provider Note   CSN: [id number] Arrival date & time: 06/11/18  2206     History   Chief Complaint Chief Complaint  Patient presents with  . V70.1    HPI Zachary Eaton is a 33 y.o. male.  The history is provided by the patient.  Mental Health Problem  Degree of incapacity (severity):  Moderate Onset quality:  Sudden Timing:  Constant Progression:  Unchanged Chronicity:  New Relieved by:  Nothing Worsened by:  Nothing Associated symptoms: no headaches    Patient presents with law enforcement under IVC.  His family/father took out the IVC paperwork.  Patient denies any complaints.  Here he reports that his family might be out to get him.  He reports he feels fine Past Medical History:  Diagnosis Date  . Migraines   . Traumatic brain injury (HCC)     There are no active problems to display for this patient.   Past Surgical History:  Procedure Laterality Date  . ANKLE SURGERY Left         Home Medications    Prior to Admission medications   Medication Sig Start Date End Date Taking? Authorizing Provider  bacitracin ointment Apply 1 application topically 2 (two) times daily. 11/11/17   Derwood KaplanNanavati, Ankit, MD    Family History No family history on file.  Social History Social History   Tobacco Use  . Smoking status: Never Smoker  . Smokeless tobacco: Current User  Substance Use Topics  . Alcohol use: Yes    Comment: occ  . Drug use: No     Allergies   Penicillins   Review of Systems Review of Systems  Constitutional: Negative for fever.  Gastrointestinal: Negative for vomiting.  Neurological: Negative for headaches.  All other systems reviewed and are negative.    Physical Exam Updated Vital Signs BP (!) 144/86 (BP Location: Right Arm)   Pulse 79   Temp 98.6 F (37 C) (Temporal)   Resp 16   Ht 1.803 m (5\' 11" )   Wt 79.4 kg   SpO2 99%   BMI 24.41 kg/m   Physical Exam  CONSTITUTIONAL: Well  developed/well nourished, no acute distress, cooperative HEAD: Normocephalic/atraumatic EYES: EOMI/PERRL, pupils dilated ENMT: Mucous membranes moist NECK: supple no meningeal signs CV: S1/S2 noted, no murmurs/rubs/gallops noted LUNGS: Lungs are clear to auscultation bilaterally, no apparent distress ABDOMEN: soft, nontender  NEURO: Pt is awake/alert/appropriate, moves all extremitiesx4.  No facial droop.   EXTREMITIES: pulses normal/equal, full ROM, legs are in cuffs SKIN: warm, color normal PSYCH: no abnormalities of mood noted, alert and oriented to situation  ED Treatments / Results  Labs (all labs ordered are listed, but only abnormal results are displayed) Labs Reviewed  COMPREHENSIVE METABOLIC PANEL - Abnormal; Notable for the following components:      Result Value   Glucose, Bld 101 (*)    Albumin 5.1 (*)    All other components within normal limits  CBC  RAPID URINE DRUG SCREEN, HOSP PERFORMED  ETHANOL  SALICYLATE LEVEL  ACETAMINOPHEN LEVEL    EKG None  Radiology No results found.  Procedures Procedures  Medications Ordered in ED Medications - No data to display   Initial Impression / Assessment and Plan / ED Course  I have reviewed the triage vital signs and the nursing notes.  Pertinent labs  results that were available during my care of the patient were reviewed by me and considered in my medical decision making (see chart for  details).     11:49 PM Patient presents from home via police for involuntary commitment paperwork that was undertaken by his father Zachary Eaton reveals patient reported he was going to take care of someone with a gun.  It has also been reported he had sleep disturbances and has been acting erratically.  He has a history of a psychiatric admission in 2017.  I spoke to his father via phone using the number listed on the IVC paperwork to confirm the information. 12:22 AM Patient has been seen by behavioral health and deemed  appropriate for admission.  I have completed the IVC and first exam paperwork.  Patient is medically stable at this time Final Clinical Impressions(s) / ED Diagnoses   Final diagnoses:  Homicidal ideation    ED Discharge Orders    None       Zadie RhineWickline, Torrez Renfroe, MD 06/12/18 [phone]

## 2018-06-11 NOTE — ED Notes (Signed)
Pt has 2 bags of belongings labeled with his name and placed in 2 lockers

## 2018-06-11 NOTE — ED Triage Notes (Addendum)
Pt brought in by [employer] under IVC. IVC paper state pt has been acting erratically today. Papers also state pt was on the way to "take care of someone." Papers also states pt "had a gun with him." Pt denies SI/HI.

## 2018-06-11 NOTE — ED Notes (Signed)
Pt rewanded by security.

## 2018-06-11 NOTE — Progress Notes (Signed)
Casimiro Needle, RN informed of pt disposition. Dr. Bebe Shaggy informed of pt disposition.

## 2018-06-11 NOTE — ED Notes (Signed)
Pt wanded by security, dressed in psych scrubs.

## 2018-06-12 ENCOUNTER — Other Ambulatory Visit: Payer: Self-pay | Admitting: Registered Nurse

## 2018-06-12 ENCOUNTER — Encounter (HOSPITAL_COMMUNITY): Payer: Self-pay | Admitting: *Deleted

## 2018-06-12 ENCOUNTER — Inpatient Hospital Stay (HOSPITAL_COMMUNITY)
Admission: AD | Admit: 2018-06-12 | Discharge: 2018-06-26 | DRG: 885 | Disposition: A | Discharge status: Home / Self Care | Payer: Federal, State, Local not specified - Other | Source: Other Acute Inpatient Hospital | Attending: Psychiatry | Admitting: Psychiatry

## 2018-06-12 ENCOUNTER — Other Ambulatory Visit: Payer: Self-pay

## 2018-06-12 DIAGNOSIS — F17210 Nicotine dependence, cigarettes, uncomplicated: Secondary | ICD-10-CM | POA: Diagnosis present

## 2018-06-12 DIAGNOSIS — F319 Bipolar disorder, unspecified: Secondary | ICD-10-CM

## 2018-06-12 DIAGNOSIS — F4310 Post-traumatic stress disorder, unspecified: Secondary | ICD-10-CM | POA: Diagnosis not present

## 2018-06-12 DIAGNOSIS — Z9114 Patient's other noncompliance with medication regimen: Secondary | ICD-10-CM | POA: Diagnosis not present

## 2018-06-12 DIAGNOSIS — F3164 Bipolar disorder, current episode mixed, severe, with psychotic features: Principal | ICD-10-CM | POA: Diagnosis present

## 2018-06-12 DIAGNOSIS — F600 Paranoid personality disorder: Secondary | ICD-10-CM | POA: Diagnosis present

## 2018-06-12 DIAGNOSIS — F322 Major depressive disorder, single episode, severe without psychotic features: Secondary | ICD-10-CM | POA: Diagnosis present

## 2018-06-12 DIAGNOSIS — G4700 Insomnia, unspecified: Secondary | ICD-10-CM | POA: Diagnosis present

## 2018-06-12 DIAGNOSIS — J101 Influenza due to other identified influenza virus with other respiratory manifestations: Secondary | ICD-10-CM | POA: Diagnosis present

## 2018-06-12 DIAGNOSIS — Z87820 Personal history of traumatic brain injury: Secondary | ICD-10-CM

## 2018-06-12 LAB — COMPREHENSIVE METABOLIC PANEL
ALBUMIN: 5.1 g/dL — AB (ref 3.5–5.0)
ALT: 16 U/L (ref 0–44)
AST: 19 U/L (ref 15–41)
Alkaline Phosphatase: 41 U/L (ref 38–126)
Anion gap: 8 (ref 5–15)
BUN: 19 mg/dL (ref 6–20)
CHLORIDE: 103 mmol/L (ref 98–111)
CO2: 27 mmol/L (ref 22–32)
CREATININE: 1.07 mg/dL (ref 0.61–1.24)
Calcium: 9.4 mg/dL (ref 8.9–10.3)
GFR calc Af Amer: >60 mL/min (ref >60)
GLUCOSE: 101 mg/dL — AB (ref 70–99)
POTASSIUM: 3.8 mmol/L (ref 3.5–5.1)
SODIUM: 138 mmol/L (ref 135–145)
Total Bilirubin: 0.5 mg/dL (ref 0.3–1.2)
Total Protein: 8 g/dL (ref 6.5–8.1)

## 2018-06-12 LAB — ETHANOL

## 2018-06-12 LAB — ACETAMINOPHEN LEVEL: Acetaminophen (Tylenol), Serum: <10 ug/mL — ABNORMAL LOW (ref 10–30)

## 2018-06-12 LAB — SALICYLATE LEVEL: Salicylate Lvl: <7 mg/dL (ref 2.8–30.0)

## 2018-06-12 MED ORDER — IBUPROFEN 400 MG PO TABS: 600.0000 mg | ORAL_TABLET | Freq: Three times a day (TID) | ORAL | Status: DC | PRN

## 2018-06-12 MED ORDER — LORAZEPAM 1 MG PO TABS: 1.0000 mg | ORAL_TABLET | Freq: Four times a day (QID) | ORAL | Status: DC | PRN

## 2018-06-12 MED ORDER — ONDANSETRON HCL 4 MG PO TABS: 4.0000 mg | ORAL_TABLET | Freq: Three times a day (TID) | ORAL | Status: DC | PRN

## 2018-06-12 MED ORDER — NICOTINE 21 MG/24HR TD PT24
21.0000 mg | MEDICATED_PATCH | Freq: Every day | TRANSDERMAL | Status: DC
  Filled 2018-06-12 (×5): qty 1

## 2018-06-12 MED ORDER — NICOTINE 21 MG/24HR TD PT24
21.0000 mg | MEDICATED_PATCH | Freq: Every day | TRANSDERMAL | Status: DC
  Administered 2018-06-12: 21 mg via TRANSDERMAL
  Filled 2018-06-12: qty 1

## 2018-06-12 NOTE — ED Notes (Signed)
Explained to pt that he would be going to [facility]. PT calm and cooperative at this time. Pt still refuses to eat.

## 2018-06-12 NOTE — Tx Team (Signed)
Initial Treatment Plan 06/12/2018 10:44 PM KEIFFER SHORTALL CHE:[id number]    PATIENT STRESSORS: Financial difficulties Legal issue Marital or family conflict   PATIENT STRENGTHS: Ability for insight Average or above average intelligence Capable of independent living General fund of knowledge Supportive family/friends   PATIENT IDENTIFIED PROBLEMS: Psychosis "I'm just ready to get back"                     DISCHARGE CRITERIA:  Ability to meet basic life and health needs Improved stabilization in mood, thinking, and/or behavior Verbal commitment to aftercare and medication compliance  PRELIMINARY DISCHARGE PLAN: Attend aftercare/continuing care group Return to previous living arrangement  PATIENT/FAMILY INVOLVEMENT: This treatment plan has been presented to and reviewed with the patient, Zachary Eaton, and/or family member, .  The patient and family have been given the opportunity to ask questions and make suggestions.  Angelea Penny, [place], [place] 06/12/2018, 10:44 PM

## 2018-06-12 NOTE — ED Notes (Signed)
Attempted report to [facility], Shanda Bumps stated nurse could not take report at this time.

## 2018-06-12 NOTE — ED Notes (Signed)
Pt looks very sad. I asked pt if there was anything he would like to talk about and he said no. Pt reports he has a headache but does not want medication for it.

## 2018-06-12 NOTE — Progress Notes (Signed)
Patient is identified as being a Cytogeneticist.  [facility] is considering accepting patient but we need to confirm that there are no available beds at any of the state's [facility].  CSW called with the following results: [facility] time with three other patients in their queue per Transfer Coordinator, April. [place] [facility] per Transfer Coordinator, [facility] per Transfer Coordinator, [facility] with "several" patient's in the queue per Transfer Coordinator, Onalee Hua   CSW reported VA bed status to [place], TTS at [facility].  Timmothy Euler. Kaylyn Lim, MSW, LCSWA Disposition Clinical Social Work [phone] (cell) [phone] (office)

## 2018-06-12 NOTE — Progress Notes (Signed)
Pt accepted to [facility], Bed 508-1 Shuvon Rankin, NP is the accepting provider.  Malvin Johns, MD is the attending provider.  Call report to [phone]  [email or web address] notified.   Pt is IVC  Pt may be transported by [employer] Pt scheduled  to arrive at [facility] @ 20:00  Carney Bern T. Kaylyn Lim, MSW, LCSWA Disposition Clinical Social Work [phone] (cell) [phone] (office)
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
## 2018-06-12 NOTE — ED Notes (Addendum)
Assisted pt to bathroom in his room. After patient finished in the bathroom, pt asked Steward Drone, volunteer, to give him a ride home. I explained to patient that he was not being discharged. I told pt that it is not up to nursing staff to release pt, that [facility] made those decisions. PT tried to leave room stating "I need a breath of fresh air." [name], [employer], told pt that he could not leave his room. Pt angrily stated "Y'all just want to keep me locked up in here".Pt exhibiting severe paranoia. I used therapeutic communication with patient and he got back into bed. I offered pt fluids, he declined. [name],  [employer], shackled pt back to bed rail. Pt is now calm and cooperative in bed.

## 2018-06-12 NOTE — ED Notes (Signed)
Carney Bern, [facility] called and said patient would be inpatient [facility] bed 508-1, Chavon Rankin NP accepting, and Doylene Canning attending MD. Call report to [phone]. Carney Bern stated the patient needs to be at [facility] at 2100 today.

## 2018-06-12 NOTE — ED Notes (Signed)
Encouraged pt to eat his food tray. Helped pt set up food. Pt refuses to eat.

## 2018-06-13 DIAGNOSIS — F4310 Post-traumatic stress disorder, unspecified: Secondary | ICD-10-CM

## 2018-06-13 MED ORDER — LITHIUM CARBONATE 150 MG PO CAPS
150.0000 mg | ORAL_CAPSULE | Freq: Two times a day (BID) | ORAL | Status: DC
  Administered 2018-06-13 – 2018-06-18 (×6): 150 mg via ORAL
  Filled 2018-06-13 (×12): qty 1

## 2018-06-13 MED ORDER — PRENATAL MULTIVITAMIN CH
1.0000 | ORAL_TABLET | Freq: Every day | ORAL | Status: DC
  Administered 2018-06-13 – 2018-06-25 (×8): 1 via ORAL
  Filled 2018-06-13 (×15): qty 1

## 2018-06-13 MED ORDER — PERPHENAZINE 2 MG PO TABS
2.0000 mg | ORAL_TABLET | Freq: Two times a day (BID) | ORAL | Status: DC
  Administered 2018-06-13 – 2018-06-15 (×3): 2 mg via ORAL
  Filled 2018-06-13 (×10): qty 1

## 2018-06-13 MED ORDER — TEMAZEPAM 15 MG PO CAPS
30.0000 mg | ORAL_CAPSULE | Freq: Every day | ORAL | Status: DC
  Administered 2018-06-15: 30 mg via ORAL
  Filled 2018-06-13 (×2): qty 2

## 2018-06-13 MED ORDER — OMEGA-3-ACID ETHYL ESTERS 1 G PO CAPS
1.0000 g | ORAL_CAPSULE | Freq: Two times a day (BID) | ORAL | Status: DC
  Administered 2018-06-13 – 2018-06-25 (×12): 1 g via ORAL
  Filled 2018-06-13 (×30): qty 1
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
## 2018-06-13 NOTE — Progress Notes (Signed)
Recreation Therapy Notes  INPATIENT RECREATION THERAPY ASSESSMENT  Patient Details Name: CAILEAN LEGEL MRN: [id number] DOB: 09-Dec-1985 Today's Date: 06/13/2018       Information Obtained From: Patient  Able to Participate in Assessment/Interview: Yes  Patient Presentation: Alert  Reason for Admission (Per Patient): Other (Comments)(Pt stated he was IVC'd by his family)  Patient Stressors: Other (Comment)(Pt stated "hard to say, pressure")  Coping Skills:   Isolation, Journal, Sports, TV, Aggression, Music, Exercise, Meditate, Deep Breathing, Talk, Prayer, Art, Avoidance, Read, Hot Bath/Shower  Leisure Interests (2+):  Ashby Dawes - Other (Comment)(Drive, ride four wheelers)  Frequency of Recreation/Participation: Weekly  Awareness of Community Resources:  Yes  Community Resources:  Library, [employer], Research scientist (physical sciences)  Current Use: Yes  If no, Barriers?:    Expressed Interest in [employer] Information: No  [employer] of Residence:  [place]  Patient Main Form of Transportation: Set designer  Patient Strengths:  Conservator, museum/gallery; Resilience  Patient Identified Areas of Improvement:  "I'm always trying to improve"  Patient Goal for Hospitalization:  "To get out of the hospital"  Current SI (including self-harm):  No  Current HI:  No  Current AVH: No  Staff Intervention Plan: Group Attendance, Collaborate with Interdisciplinary Treatment Team  Consent to Intern Participation: N/A    Caroll Rancher, LRT/CTRS  Caroll Rancher A 06/13/2018, 12:13 PM

## 2018-06-13 NOTE — Plan of Care (Signed)
Letter to the clerk of court of [facility], this letter is to inform that Mr. Zachary Eaton was hospitalized last evening at [facility] behavioral health under petition for involuntary commitment and therefore will not make his court appearance today-fax to [phone]

## 2018-06-13 NOTE — Progress Notes (Signed)
Adult Psychoeducational Group Note  Date:  06/13/2018 Time:  8:41 PM  Group Topic/Focus:  Wrap-Up Group:   The focus of this group is to help patients review their daily goal of treatment and discuss progress on daily workbooks.  Participation Level:  Active  Participation Quality:  Appropriate  Affect:  Appropriate  Cognitive:  Appropriate  Insight: Appropriate  Engagement in Group:  Engaged  Modes of Intervention:  Discussion  Additional Comments: The patient expressed that he rates today a 5.The patient also said that he attended group.  Octavio Manns 06/13/2018, 8:41 PM

## 2018-06-13 NOTE — Progress Notes (Signed)
Recreation Therapy Notes  Date: 1.30.20 Time: 0945 Location: 500 Hall Dayroom  Group Topic: Wellness  Goal Area(s) Addresses:  Patient will define components of whole wellness. Patient will verbalize benefit of whole wellness.  Behavioral Response: Engaged  Intervention:  Music  Activity:  Exercise.  LRT and patients went through a series of stretches.  Patients were then allowed to lead the group in an exercise of their choice.  Patients were allowed to rest when needed and take water breaks as needed.  Education: Wellness, Building control surveyorDischarge Planning.   Education Outcome: Acknowledges education/In group clarification offered/Needs additional education.   Clinical Observations/Feedback: Pt arrived late to group.  Pt joined right in with the exercises.  Pt was pleasant and engaged.  Pt led some exercises and completed the exercises presented in group.    Caroll RancherMarjette Crickett Abbett, LRT/CTRS     Caroll RancherLindsay, Lasundra Hascall A 06/13/2018 11:29 AM

## 2018-06-13 NOTE — BHH Suicide Risk Assessment (Signed)
[facility] Admission Suicide Risk Assessment   Nursing information obtained from:  Patient Demographic factors:  Male, Adolescent or young adult, Caucasian, Unemployed, Access to firearms Current Mental Status:  NA Loss Factors:  NA Historical Factors:  NA Risk Reduction Factors:  Living with another person, especially a relative, Positive coping skills or problem solving skills  Total Time spent with patient: 45 minutes Principal Problem: <principal problem not specified> Diagnosis:  Active Problems:   MDD (major depressive disorder), severe (HCC)   PTSD (post-traumatic stress disorder)  Subjective Data: Patient required petition for involuntary commitment due to leaving the home with a rifle reporting that he was going to harm someone that was unnamed at the time see the admission note  Continued Clinical Symptoms:  Alcohol Use Disorder Identification Test Final Score (AUDIT): 1 The "Alcohol Use Disorders Identification Test", Guidelines for Use in Primary Care, Second Edition.  World Science writer [facility]). Score between 0-7:  no or low risk or alcohol related problems. Score between 8-15:  moderate risk of alcohol related problems. Score between 16-19:  high risk of alcohol related problems. Score 20 or above:  warrants further diagnostic evaluation for alcohol dependence and treatment.   CLINICAL FACTORS:   Bipolar Disorder:   Mixed State     COGNITIVE FEATURES THAT CONTRIBUTE TO RISK:  Loss of executive function    SUICIDE RISK:   Moderate:  Frequent suicidal ideation with limited intensity, and duration, some specificity in terms of plans, no associated intent, good self-control, limited dysphoria/symptomatology, some risk factors present, and identifiable protective factors, including available and accessible social support.  PLAN OF CARE: Neuro protection, diagnostic clarity, reality based therapy, removal of weapons  I certify that inpatient services furnished can  reasonably be expected to improve the patient's condition.   Malvin Johns, MD 06/13/2018, 10:54 AM

## 2018-06-13 NOTE — Progress Notes (Signed)
Zachary Eaton arrived on the unit, RN introduced self.  He reported he just wanted to go to his room and sleep.  He declined needing medication and didn't want to answer any questions.  He promptly went to his room and is currently resting with his eyes closed.  He appears to be asleep.

## 2018-06-13 NOTE — Progress Notes (Signed)
AC Lamount CrankerChris Judge informed CSW that pt has court date today at [facility] [place] and had requested letter be sent to DA.  Fax: [phone].  This was done. Garner NashGregory Garrell Flagg, MSW, LCSW Clinical Social Worker 06/13/2018 10:19 AM

## 2018-06-13 NOTE — Progress Notes (Signed)
D: Pt denies SI/HI/AVH. Pt is very paranoid and suspicious on the unit , pt has delayed responses to questions at times, possible thought blocking.   A: Pt was offered support and encouragement. Pt was encourage to attend groups. Q 15 minute checks were done for safety.  R:  safety maintained on unit.  Problem: Education: Goal: Emotional status will improve Outcome: Not Progressing   Problem: Education: Goal: Mental status will improve Outcome: Not Progressing   Problem: Activity: Goal: Interest or engagement in activities will improve Outcome: Not Progressing

## 2018-06-14 NOTE — Progress Notes (Signed)
Pt presents with a flat affect and a depressed mood. Pt noted to be isolative and withdrawn. Pt refused morning meds this morning when offered. Pt expressed that he's not a fan of taking meds. However, pt did agree to taking evening meds. Pt denies SI/HI. Pt expressed that he needs to return home soon so that he can check on his dog.   Medications reviewed with pt. Verbal support provided. Pt encouraged to attend groups. 15 minute checks performed for safety.  Pt receptive to tx plan.

## 2018-06-14 NOTE — Progress Notes (Signed)
D: Pt denies SI/HI/AVH. Pt continues to be paranoid, pt stayed in room majority of the evening.   A: Pt was offered support and encouragement. Pt was given scheduled medications. Pt was encourage to attend groups. Q 15 minute checks were done for safety.   R:Pt attends groups and interacts well with peers and staff. Pt is taking medication .Pt receptive to treatment and safety maintained on unit.  Problem: Activity: Goal: Interest or engagement in activities will improve Outcome: Not Progressing   Problem: Activity: Goal: Sleeping patterns will improve 06/14/2018 2248 by Delos Haring, RN Outcome: Progressing 06/14/2018 2247 by Delos Haring, RN Outcome: Progressing

## 2018-06-14 NOTE — Progress Notes (Signed)
Recreation Therapy Notes  Date: 1.31.20 Time: 1000 Location: 500 Hall Dayroom  Group Topic: Self-Esteem  Goal Area(s) Addresses:  Patient will successfully identify positive attributes about themselves.  Patient will successfully identify benefit of improved self-esteem.   Intervention: Holiday representative paper, magazines, scissors, glue sticks, music, colored pencils  Activity: Collage.  Patients were to use the supplies provided to create a collage that highlighted positive qualities about themselves as well as things that are important to them.  Education:  Self-Esteem, Building control surveyor.   Education Outcome: Acknowledges education/In group clarification offered/Needs additional education  Clinical Observations/Feedback:  Pt did not attend group.     Caroll Rancher, LRT/CTRS     Caroll Rancher A 06/14/2018 11:46 AM

## 2018-06-14 NOTE — Progress Notes (Signed)
[facility] MD Progress Note  06/14/2018 10:24 AM Zachary Eaton  MRN:  [id number] Subjective:   Zachary Eaton did not take any of his medications, he remains passive, staying to himself in his room, not exactly guarded but there seems to be almost a poverty of content to his speech and thoughts. He will answer specific questions minimally. His mother phone to see how he was doing he is given me permission to talk to her, she passed on her cell phone number requested that he give her the code so that she might talk to him. She is hoping he may go to a rehab type facility for veterans for longer-term improvement/care and this is in [place] she is discussed this briefly with me and will discuss with the caseworker/social worker  The patient denies wanting to harm self or others, continues to deny that he had a rifle and so forth and stating he just wanted to "go clear his head" His mother states that the guns are locked up in a safe and she is going to try and get the combination from him and be the one in charge of the combination herself  Principal Problem: Bipolar symptomatology, recent dangerousness, cognitive decline all in the context of at least 3 severe traumatic brain injuries Diagnosis: Active Problems:   MDD (major depressive disorder), severe (HCC)   PTSD (post-traumatic stress disorder)  Total Time spent with patient: 20 minutes  Past Medical History:  Past Medical History:  Diagnosis Date  . Migraines   . Traumatic brain injury [facility])     Past Surgical History:  Procedure Laterality Date  . ANKLE SURGERY Left    Family History: History reviewed. No pertinent family history.  Social History:  Social History   Substance and Sexual Activity  Alcohol Use Yes   Comment: occ     Social History   Substance and Sexual Activity  Drug Use Not Currently    Social History   Socioeconomic History  . Marital status: Single    Spouse name: Not on file  . Number of children: Not on  file  . Years of education: Not on file  . Highest education level: Not on file  Occupational History  . Not on file  Social Needs  . Financial resource strain: Not on file  . Food insecurity:    Worry: Not on file    Inability: Not on file  . Transportation needs:    Medical: Not on file    Non-medical: Not on file  Tobacco Use  . Smoking status: Current Some Day Smoker    Packs/day: 0.25    Types: Cigarettes  . Smokeless tobacco: Current User    Types: Chew  Substance and Sexual Activity  . Alcohol use: Yes    Comment: occ  . Drug use: Not Currently  . Sexual activity: Not Currently  Lifestyle  . Physical activity:    Days per week: Not on file    Minutes per session: Not on file  . Stress: Not on file  Relationships  . Social connections:    Talks on phone: Not on file    Gets together: Not on file    Attends religious service: Not on file    Active member of club or organization: Not on file    Attends meetings of clubs or organizations: Not on file    Relationship status: Not on file  Other Topics Concern  . Not on file  Social History Narrative  . Not  on file   Additional Social History:                         Sleep: Fair  Appetite:  Fair  Current Medications: Current Facility-Administered Medications  Medication Dose Route Frequency Provider Last Rate Last Dose  . lithium carbonate capsule 150 mg  150 mg Oral BID WC Malvin JohnsFarah, Kasch Borquez, MD   150 mg at 06/13/18 1823  . nicotine (NICODERM CQ - dosed in mg/24 hours) patch 21 mg  21 mg Transdermal Daily Malvin JohnsFarah, Jefry Lesinski, MD      . omega-3 acid ethyl esters (LOVAZA) capsule 1 g  1 g Oral BID Malvin JohnsFarah, Shanira Tine, MD   1 g at 06/13/18 1823  . perphenazine (TRILAFON) tablet 2 mg  2 mg Oral BID Malvin JohnsFarah, Terrilyn Tyner, MD   2 mg at 06/13/18 1823  . prenatal multivitamin tablet 1 tablet  1 tablet Oral Q1200 Malvin JohnsFarah, Granville Whitefield, MD   1 tablet at 06/13/18 1823  . temazepam (RESTORIL) capsule 30 mg  30 mg Oral QHS Malvin JohnsFarah, Bertine Schlottman, MD         Lab Results: No results found for this or any previous visit (from the past 48 hour(s)).  Blood Alcohol level:  Lab Results  Component Value Date   ETH <10 06/11/2018    Metabolic Disorder Labs: No results found for: HGBA1C, MPG No results found for: PROLACTIN No results found for: CHOL, TRIG, HDL, CHOLHDL, VLDL, LDLCALC  Physical Findings: AIMS: Facial and Oral Movements Muscles of Facial Expression: None, normal Lips and Perioral Area: None, normal Jaw: None, normal Tongue: None, normal,Extremity Movements Upper (arms, wrists, hands, fingers): None, normal Lower (legs, knees, ankles, toes): None, normal, Trunk Movements Neck, shoulders, hips: None, normal, Overall Severity Severity of abnormal movements (highest score from questions above): None, normal Incapacitation due to abnormal movements: None, normal Patient's awareness of abnormal movements (rate only patient's report): No Awareness, Dental Status Current problems with teeth and/or dentures?: No Does patient usually wear dentures?: No  CIWA:    COWS:     Musculoskeletal: Strength & Muscle Tone: within normal limits Gait & Station: normal Patient leans: N/A  Psychiatric Specialty Exam: Physical Exam  ROS  Blood pressure 105/70, pulse 63, temperature 97.8 F (36.6 C), temperature source Oral, resp. rate 16, height 5\' 9"  (1.753 m), weight 74.8 kg.Body mass index is 24.37 kg/m.  General Appearance: Casual  Eye Contact:  Minimal  Speech:  Slow  Volume:  Decreased  Mood:  Dysphoric  Affect:  Restricted  Thought Process:  Descriptions of Associations: Loose  Orientation:  Full (Time, Place, and Person)  Thought Content:  Poverty of content of speech and thought  Suicidal Thoughts:  No  Homicidal Thoughts:  No  Memory:  Immediate;   Poor  Judgement:  Impaired  Insight:  Shallow  Psychomotor Activity:  Decreased  Concentration:  Concentration: Poor  Recall:  Poor  Fund of Knowledge:  Poor  Language:   Difficult to gauge due to minimal engagement in the interview process  Akathisia:  Negative  Handed:  Right  AIMS (if indicated):     Assets:  Resilience  ADL's:  Intact  Cognition:  WNL  Sleep:  Number of Hours: 6.75     Treatment Plan Summary: Daily contact with patient to assess and evaluate symptoms and progress in treatment, Medication management and Plan Continue to encourage compliance reminded them the majority of his medications are indeed natural and neuroprotective continue cognitive and reality based  therapies continue current precautions, with regards to dangerousness I do not think the patient truly recalls his activities prior to hospitalization  Malvin Johns, MD 06/14/2018, 10:24 AM

## 2018-06-14 NOTE — Tx Team (Signed)
Interdisciplinary Treatment and Diagnostic Plan Update  06/14/2018 Time of Session: 10:55 AM  Zachary Eaton MRN: [id number]  Principal Diagnosis: <principal problem not specified>  Secondary Diagnoses: Active Problems:   MDD (major depressive disorder), severe (HCC)   PTSD (post-traumatic stress disorder)   Current Medications:  Current Facility-Administered Medications  Medication Dose Route Frequency Provider Last Rate Last Dose   lithium carbonate capsule 150 mg  150 mg Oral BID WC [name], MD   150 mg at 06/14/18 1708   nicotine (NICODERM CQ - dosed in mg/24 hours) patch 21 mg  21 mg Transdermal Daily [name], MD       omega-3 acid ethyl esters (LOVAZA) capsule 1 g  1 g Oral BID [name], MD   1 g at 06/14/18 1708   perphenazine (TRILAFON) tablet 2 mg  2 mg Oral BID [name], MD   2 mg at 06/14/18 1708   prenatal multivitamin tablet 1 tablet  1 tablet Oral Q1200 [name], MD   1 tablet at 06/14/18 1306   temazepam (RESTORIL) capsule 30 mg  30 mg Oral QHS [name], MD       PTA Medications: No medications prior to admission.    Patient Stressors: Arts development officer issue Marital or family conflict  Patient Strengths: Ability for insight Average or above average intelligence Capable of independent living General fund of knowledge Supportive family/friends  Treatment Modalities: Medication Management, Group therapy, Case management,  1 to 1 session with clinician, Psychoeducation, Recreational therapy.   Physician Treatment Plan for Primary Diagnosis: <principal problem not specified> Long Term Goal(s): Improvement in symptoms so as ready for discharge Improvement in symptoms so as ready for discharge   Short Term Goals: Ability to maintain clinical measurements within normal limits will improve Compliance with prescribed medications will improve Ability to identify triggers associated with substance abuse/mental health  issues will improve Ability to maintain clinical measurements within normal limits will improve Compliance with prescribed medications will improve Ability to identify triggers associated with substance abuse/mental health issues will improve  Medication Management: Evaluate patient's response, side effects, and tolerance of medication regimen.  Therapeutic Interventions: 1 to 1 sessions, Unit Group sessions and Medication administration.  Evaluation of Outcomes: Not Met  Physician Treatment Plan for Secondary Diagnosis: Active Problems:   MDD (major depressive disorder), severe (HCC)   PTSD (post-traumatic stress disorder)  Long Term Goal(s): Improvement in symptoms so as ready for discharge Improvement in symptoms so as ready for discharge   Short Term Goals: Ability to maintain clinical measurements within normal limits will improve Compliance with prescribed medications will improve Ability to identify triggers associated with substance abuse/mental health issues will improve Ability to maintain clinical measurements within normal limits will improve Compliance with prescribed medications will improve Ability to identify triggers associated with substance abuse/mental health issues will improve     Medication Management: Evaluate patient's response, side effects, and tolerance of medication regimen.  Therapeutic Interventions: 1 to 1 sessions, Unit Group sessions and Medication administration.  Evaluation of Outcomes: Not Met   RN Treatment Plan for Primary Diagnosis: <principal problem not specified> Long Term Goal(s): Knowledge of disease and therapeutic regimen to maintain health will improve  Short Term Goals: Ability to identify and develop effective coping behaviors will improve and Compliance with prescribed medications will improve  Medication Management: RN will administer medications as ordered by provider, will assess and evaluate patient's response and provide  education to patient for prescribed medication. RN will  report any adverse and/or side effects to prescribing provider.  Therapeutic Interventions: 1 on 1 counseling sessions, Psychoeducation, Medication administration, Evaluate responses to treatment, Monitor vital signs and CBGs as ordered, Perform/monitor CIWA, COWS, AIMS and Fall Risk screenings as ordered, Perform wound care treatments as ordered.  Evaluation of Outcomes: Not Met   LCSW Treatment Plan for Primary Diagnosis: <principal problem not specified> Long Term Goal(s): Safe transition to appropriate next level of care at discharge, Engage patient in therapeutic group addressing interpersonal concerns.  Short Term Goals: Engage patient in aftercare planning with referrals and resources, Increase social support and Increase skills for wellness and recovery  Therapeutic Interventions: Assess for all discharge needs, 1 to 1 time with Social worker, Explore available resources and support systems, Assess for adequacy in community support network, Educate family and significant other(s) on suicide prevention, Complete Psychosocial Assessment, Interpersonal group therapy.  Evaluation of Outcomes: Not Met   Progress in Treatment: Attending groups: No. Participating in groups: No. Taking medication as prescribed: No. Toleration medication: No. Family/Significant other contact made: No, patient decline Patient understands diagnosis: No. Discussing patient identified problems/goals with staff: No. Medical problems stabilized or resolved: No. Denies suicidal/homicidal ideation: Yes. Issues/concerns per patient self-inventory: No. Other:   New problem(s) identified: No, Describe:  None reported  New Short Term/Long Term Goal(s):   Patient Goals:  "Get back to feeling better"  Discharge Plan or Barriers: Access to transportation  Reason for Continuation of Hospitalization: Aggression Depression Delusions Homicidal  ideation Medical Issues, TBI Medication stabilization Other; describe PTSD severe  Estimated Length of Stay: 5-7 days  Attendees: Patient: 06/14/2018 5:35 PM  Physician:  06/14/2018 5:35 PM  Nursing:  06/14/2018 5:35 PM  RN Care Manager: 06/14/2018 5:35 PM  Social Worker:  06/14/2018 5:35 PM  Recreational Therapist:  06/14/2018 5:35 PM  Other:  06/14/2018 5:35 PM  Other:  06/14/2018 5:35 PM  Other: 06/14/2018 5:35 PM    Scribe for Treatment Team: Lawana Pai, MSW Intern 06/14/2018 5:35 PM

## 2018-06-15 MED ORDER — LORAZEPAM 1 MG PO TABS: 1.0000 mg | ORAL_TABLET | ORAL | Status: DC | PRN

## 2018-06-15 MED ORDER — NICOTINE POLACRILEX 2 MG MT GUM
CHEWING_GUM | OROMUCOSAL | Status: AC
  Administered 2018-06-15
  Filled 2018-06-15: qty 1

## 2018-06-15 MED ORDER — NICOTINE POLACRILEX 2 MG MT GUM
2.0000 mg | CHEWING_GUM | OROMUCOSAL | Status: DC | PRN
  Administered 2018-06-25 (×2): 2 mg via ORAL
  Filled 2018-06-15: qty 1

## 2018-06-15 MED ORDER — ZIPRASIDONE MESYLATE 20 MG IM SOLR: 20.0000 mg | Freq: Two times a day (BID) | INTRAMUSCULAR | Status: DC | PRN

## 2018-06-15 NOTE — Plan of Care (Signed)
D: Patient presents calm cooperative, providing minimal information. He is observed resting in bed. He denies a need for medications, and feels he is doing well. He is angry with his parents for petitioning for IVC. Patient denies SI/HI/AVH. He slept well last night, and did not request medication to help. His appetite is good, energy high and concentration good. He denies physical symptoms or withdrawal complaints.  A: Patient checked q15 min, and checks reviewed. Reviewed medication changes with patient and educated on side effects. Educated patient on importance of attending group therapy sessions and educated on several coping skills. Encouarged participation in milieu through recreation therapy and attending meals with peers. Support and encouragement provided. Fluids offered. R: Patient receptive to education on medications, but is noncompliant with medications. Patient contracts for safety on the unit.  Problem: Education: Goal: Emotional status will improve Outcome: Progressing Goal: Mental status will improve Outcome: Progressing   Problem: Activity: Goal: Sleeping patterns will improve Outcome: Progressing   Problem: Activity: Goal: Interest or engagement in activities will improve Outcome: Not Progressing

## 2018-06-15 NOTE — Progress Notes (Signed)
BH MD Progress Note  06/15/2018 12:49 PM Zachary Eaton  MRN:  [id number] Subjective: Patient is seen and examined.  Patient is a 33 year old male with a past psychiatric history with concern for bipolar disorder, previous traumatic brain injury, posttraumatic stress disorder, and psychotic symptoms.  He was admitted on 06/13/2018.  Objective: Patient is seen and examined.  Patient is a 33 year old male with the above-stated past psychiatric history who is seen in follow-up.  Nursing reflects that he has been noncompliant with his medications.  He is on unit restriction.  He remains significantly paranoid.  He thinks that "something else was going on" about his involuntary commitment.  He is focused on his involuntary commitment and wants to know why we are holding over 72 hours.  He thinks that his family may have something "out to get me".  He also is having some hyper religious delusions as well.  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  He [facility] on 04/[facility].  At that time he was transferred [facility] the [facility].  We do not have those records available.  He had been given Zyprexa 5 mg p.o. nightly at that time, and his drug screen was positive for marijuana.  Review of his laboratories revealed essentially all normal laboratories.  He denied any suicidal ideation or homicidal ideation today.  His medications include lithium carbonate 150 mg p.o. twice daily, Trilafon 2 mg p.o. twice daily, temazepam 30 mg p.o. nightly.  His vital signs are stable, he is afebrile.  He slept 6.25 hours last night.  Principal Problem: <principal problem not specified> Diagnosis: Active Problems:   MDD (major depressive disorder), severe (HCC)   PTSD (post-traumatic stress disorder)  Total Time spent with  patient: 15 minutes  Past Psychiatric History: See admission H&P  Past Medical History:  Past Medical History:  Diagnosis Date  . Migraines   . Traumatic brain injury [facility](HCC)     Past Surgical History:  Procedure Laterality Date  . ANKLE SURGERY Left    Family History: History reviewed. No pertinent family history. Family Psychiatric  History: See admission H&P Social History:  Social History   Substance and Sexual Activity  Alcohol Use Yes   Comment: occ     Social History   Substance and Sexual Activity  Drug Use Not Currently    Social History   Socioeconomic History  . Marital status: Single    Spouse name: Not on file  . Number of children: Not on file  . Years of education: Not on file  . Highest education level: Not on file  Occupational History  . Not on file  Social Needs  . Financial resource strain: Not on file  . Food insecurity:    Worry: Not on file    Inability: Not on file  . Transportation needs:    Medical: Not on file    Non-medical: Not on file  Tobacco Use  . Smoking status: Current Some Day Smoker    Packs/day: 0.25    Types: Cigarettes  . Smokeless tobacco: Current User    Types: Chew  Substance and Sexual Activity  . Alcohol use: Yes    Comment: occ  . Drug use: Not Currently  . Sexual activity: Not Currently  Lifestyle  . Physical activity:    Days per week: Not  on file    Minutes per session: Not on file  . Stress: Not on file  Relationships  . Social connections:    Talks on phone: Not on file    Gets together: Not on file    Attends religious service: Not on file    Active member of club or organization: Not on file    Attends meetings of clubs or organizations: Not on file    Relationship status: Not on file  Other Topics Concern  . Not on file  Social History Narrative  . Not on file   Additional Social History:                         Sleep: Fair  Appetite:  Fair  Current Medications: Current  Facility-Administered Medications  Medication Dose Route Frequency Provider Last Rate Last Dose  . lithium carbonate capsule 150 mg  150 mg Oral BID WC Malvin Johns, MD   150 mg at 06/14/18 1708  . nicotine (NICODERM CQ - dosed in mg/24 hours) patch 21 mg  21 mg Transdermal Daily Malvin Johns, MD      . omega-3 acid ethyl esters (LOVAZA) capsule 1 g  1 g Oral BID Malvin Johns, MD   1 g at 06/14/18 1708  . perphenazine (TRILAFON) tablet 2 mg  2 mg Oral BID Malvin Johns, MD   2 mg at 06/14/18 1708  . prenatal multivitamin tablet 1 tablet  1 tablet Oral Q1200 Malvin Johns, MD   1 tablet at 06/15/18 1218  . temazepam (RESTORIL) capsule 30 mg  30 mg Oral QHS Malvin Johns, MD        Lab Results: No results found for this or any previous visit (from the past 48 hour(s)).  Blood Alcohol level:  Lab Results  Component Value Date   ETH <10 06/11/2018    Metabolic Disorder Labs: No results found for: HGBA1C, MPG No results found for: PROLACTIN No results found for: CHOL, TRIG, HDL, CHOLHDL, VLDL, LDLCALC  Physical Findings: AIMS: Facial and Oral Movements Muscles of Facial Expression: None, normal Lips and Perioral Area: None, normal Jaw: None, normal Tongue: None, normal,Extremity Movements Upper (arms, wrists, hands, fingers): None, normal Lower (legs, knees, ankles, toes): None, normal, Trunk Movements Neck, shoulders, hips: None, normal, Overall Severity Severity of abnormal movements (highest score from questions above): None, normal Incapacitation due to abnormal movements: None, normal Patient's awareness of abnormal movements (rate only patient's report): No Awareness, Dental Status Current problems with teeth and/or dentures?: No Does patient usually wear dentures?: No  CIWA:  CIWA-Ar Total: 0 COWS:  COWS Total Score: 0  Musculoskeletal: Strength & Muscle Tone: within normal limits Gait & Station: normal Patient leans: N/A  Psychiatric Specialty Exam: Physical Exam   Nursing note and vitals reviewed. Constitutional: He is oriented to person, place, and time. He appears well-developed and well-nourished.  HENT:  Head: Normocephalic and atraumatic.  Respiratory: Effort normal.  Neurological: He is alert and oriented to person, place, and time.    ROS  Blood pressure (!) 87/68, pulse 82, temperature 98 F (36.7 C), temperature source Oral, resp. rate 16, height 5\' 9"  (1.753 m), weight 74.8 kg.Body mass index is 24.37 kg/m.  General Appearance: Casual  Eye Contact:  Fair  Speech:  Normal Rate  Volume:  Normal  Mood:  Anxious, Dysphoric and Irritable  Affect:  Congruent  Thought Process:  Coherent and Descriptions of Associations: Circumstantial  Orientation:  Full (Time,  Place, and Person)  Thought Content:  Delusions and Paranoid Ideation  Suicidal Thoughts:  No  Homicidal Thoughts:  No  Memory:  Immediate;   Fair Recent;   Fair Remote;   Fair  Judgement:  Impaired  Insight:  Lacking  Psychomotor Activity:  Increased  Concentration:  Concentration: Fair and Attention Span: Fair  Recall:  [employer] of Knowledge:  Fair  Language:  Fair  Akathisia:  Negative  Handed:  Right  AIMS (if indicated):     Assets:  Desire for Improvement Housing Physical Health Resilience  ADL's:  Intact  Cognition:  WNL  Sleep:  Number of Hours: 6.25     Treatment Plan Summary: Daily contact with patient to assess and evaluate symptoms and progress in treatment, Medication management and Plan : Patient is seen and examined.  Patient is a 33 year old male with the above-stated past psychiatric history who is seen in follow-up.  He remains significantly delusional, paranoid.  Nursing reflects that he has been noncompliant with his medications.  He is obsessing over the involuntary commitment and believes he is being held here illegally.  I am not going to change any of his medications today.  We need to get these in his system to see if these would be effective  at all.  Right now I do not think we could do forced medications on him, but I have encouraged him to take the Trilafon and lithium.  Hopefully he will recognize the need for this. 1.  Continue lithium carbonate 150 mg p.o. twice daily for mood stability. 2.  Continue Trilafon 2 mg p.o. twice daily for psychosis. 3.  Continue temazepam 30 mg p.o. nightly for insomnia. 4.  Disposition planning-in progress.  Antonieta Pert, MD 06/15/2018, 12:49 PM

## 2018-06-15 NOTE — Progress Notes (Signed)
Patient refused all morning medications. Educated on importance of taking medications as prescribed. Patient awake in bed, stating, "I don't think I need them."

## 2018-06-15 NOTE — B[facility] Group Notes (Signed)
  [facility] LCSW Group Therapy Note  Date/Time:  06/15/2018 11:15AM-12:00PM  Type of Therapy and Topic:  Group Therapy:  Feelings About Hospitalization  Participation Level:  Active   Description of Group This process group involved patients discussing their feelings related to being hospitalized, as well as the benefits they see to being in the hospital.  These feelings and benefits were itemized.  The group then brainstormed specific ways in which they could seek those same benefits when they discharge and return home.  Therapeutic Goals 1. Patient will identify and describe positive and negative feelings related to hospitalization 2. Patient will verbalize benefits of hospitalization to themselves personally 3. Patients will brainstorm together ways they can obtain similar benefits in the outpatient setting, identify barriers to wellness and possible solutions  Summary of Patient Progress:  The patient expressed his primary feelings about being hospitalized are "not a big fan, but it's better than past experiences."  He would not discuss it further, but did listen attentively.  Therapeutic Modalities Cognitive Behavioral Therapy Motivational Interviewing    Ambrose Mantle, LCSW 06/15/2018, 2:28 PM

## 2018-06-15 NOTE — Plan of Care (Signed)
D: Pt denies SI/HI/AVH. Pt is pleasant and cooperative. Pt continues to be paranoid, but pt was visible in the dayroom watching TV with peers. Pt was reluctant to take Restoril this evening, pt educated that it was only for sleep, pt finally stated he would give it a try, pt encouraged to let the doctor know how he felt in the morning.    A: Pt was offered support and encouragement. Pt was given scheduled medication. Pt was encourage to attend groups. Q 15 minute checks were done for safety.   R:Pt attends groups and interacts well with peers and staff. Pt is taking medication. Pt receptive to treatment and safety maintained on unit.  Problem: Education: Goal: Emotional status will improve Outcome: Progressing   Problem: Education: Goal: Verbalization of understanding the information provided will improve Outcome: Progressing   Problem: Activity: Goal: Interest or engagement in activities will improve Outcome: Progressing

## 2018-06-16 MED ORDER — OLANZAPINE 2.5 MG PO TABS
2.5000 mg | ORAL_TABLET | Freq: Every day | ORAL | Status: DC
  Filled 2018-06-16 (×2): qty 1

## 2018-06-16 NOTE — Progress Notes (Signed)
D: Pt denies SI/HI/AVH. Pt is pleasant and cooperative. Pt visible on the unit. Pt refused his Zyprexa this evening because he stated he had a bad experience with it in the past.   A: Pt was offered support and encouragement.  Pt was encourage to attend groups. Q 15 minute checks were done for safety.   R:Pt attends groups and interacts well with peers and staff.  Pt has no complaints.Pt receptive to treatment and safety maintained on unit.   Problem: Activity: Goal: Sleeping patterns will improve Outcome: Progressing   Problem: Safety: Goal: Periods of time without injury will increase Outcome: Progressing   Problem: Coping: Goal: Coping ability will improve Outcome: Progressing   Problem: Coping: Goal: Will verbalize feelings Outcome: Progressing

## 2018-06-16 NOTE — Progress Notes (Signed)
D: Patient refused morning medications. "I need to get these other ones out of my system."  A: Educated on importance of consistently taking medications. R: Patient resting in bed.

## 2018-06-16 NOTE — Plan of Care (Signed)
D: Patient presents isolative, paranoid. He is calm but refusing morning medications. He did take his midday multivitamin. Patient denies SI/HI/AVH. Patient refused to fill out self inventory. He states "I feel fine. I don't need anything." He denies anxiety, and reports sleeping well last night.  A: Patient checked q15 min, and checks reviewed. Reviewed medication changes with patient and educated on side effects. Educated patient on importance of attending group therapy sessions and educated on several coping skills. Encouarged participation in milieu through recreation therapy and attending meals with peers. Support and encouragement provided. Fluids offered. R: Patient reports "I need to let the medicine from last night get out of my system." Patient contracts for safety on the unit.  Problem: Education: Goal: Emotional status will improve Outcome: Not Progressing Goal: Mental status will improve Outcome: Not Progressing   Problem: Activity: Goal: Interest or engagement in activities will improve Outcome: Not Progressing   Problem: Activity: Goal: Sleeping patterns will improve Outcome: Progressing

## 2018-06-16 NOTE — Progress Notes (Signed)
[facility]  06/16/2018 12:51 PM Zachary Eaton  MRN:  [id number] Subjective:  Patient is seen and examined.  Patient is a 33 year old male with a past psychiatric history with concern for bipolar disorder, previous traumatic brain injury, posttraumatic stress disorder, and psychotic symptoms.  He was admitted on 06/13/2018.  Objective: Patient is seen and examined.  Patient is a 33 year old male with the above-stated past psychiatric history who is seen in follow-up.  He did take his medications last night, and slept several hours.  The nursing staff stated he did not take his medications this morning.  He remains somewhat paranoid and irritable.  He wants to know when he is going to be released.  He does not understand why he is still here.  I attempted to explain to him about his involuntary commitment, but he becomes even more irritable.  We discussed taking his medications and attempting to be compliant with the medications to get out of the hospital sooner, but he just becomes more paranoid.  Denied any auditory or visual hallucinations.  He denied any suicidal or homicidal ideation.  Principal Problem: <principal problem not specified> Diagnosis: Active Problems:   MDD (major depressive disorder), severe (HCC)   PTSD (post-traumatic stress disorder)  Total Time spent with patient: 15 minutes  Past Psychiatric History: See admission H&P  Past Medical History:  Past Medical History:  Diagnosis Date  . Migraines   . Traumatic brain injury [facility](HCC)     Past Surgical History:  Procedure Laterality Date  . ANKLE SURGERY Left    Family History: History reviewed. No pertinent family history. Family Psychiatric  History: See admission H&P Social History:  Social History   Substance and Sexual Activity  Alcohol Use Yes   Comment: occ     Social History   Substance and Sexual Activity  Drug Use Not Currently    Social History   Socioeconomic History  . Marital status:  Single    Spouse name: Not on file  . Number of children: Not on file  . Years of education: Not on file  . Highest education level: Not on file  Occupational History  . Not on file  Social Needs  . Financial resource strain: Not on file  . Food insecurity:    Worry: Not on file    Inability: Not on file  . Transportation needs:    Medical: Not on file    Non-medical: Not on file  Tobacco Use  . Smoking status: Current Some Day Smoker    Packs/day: 0.25    Types: Cigarettes  . Smokeless tobacco: Current User    Types: Chew  Substance and Sexual Activity  . Alcohol use: Yes    Comment: occ  . Drug use: Not Currently  . Sexual activity: Not Currently  Lifestyle  . Physical activity:    Days per week: Not on file    Minutes per session: Not on file  . Stress: Not on file  Relationships  . Social connections:    Talks on phone: Not on file    Gets together: Not on file    Attends religious service: Not on file    Active member of club or organization: Not on file    Attends meetings of clubs or organizations: Not on file    Relationship status: Not on file  Other Topics Concern  . Not on file  Social History Narrative  . Not on file   Additional Social History:  Sleep: Good  Appetite:  Good  Current Medications: Current Facility-Administered Medications  Medication Dose Route Frequency Provider Last Rate Last Dose  . lithium carbonate capsule 150 mg  150 mg Oral BID WC Malvin Johns, MD   150 mg at 06/15/18 1722  . ziprasidone (GEODON) injection 20 mg  20 mg Intramuscular Q12H PRN Antonieta Pert, MD       And  . LORazepam (ATIVAN) tablet 1 mg  1 mg Oral PRN Antonieta Pert, MD      . nicotine polacrilex (NICORETTE) gum 2 mg  2 mg Oral PRN Nira Conn A, NP      . omega-3 acid ethyl esters (LOVAZA) capsule 1 g  1 g Oral BID Malvin Johns, MD   1 g at 06/15/18 1723  . perphenazine (TRILAFON) tablet 2 mg  2 mg Oral BID Malvin Johns, MD   2 mg at 06/15/18 1722  . prenatal multivitamin tablet 1 tablet  1 tablet Oral Q1200 Malvin Johns, MD   1 tablet at 06/16/18 1143  . temazepam (RESTORIL) capsule 30 mg  30 mg Oral QHS Malvin Johns, MD   30 mg at 06/15/18 2116    Lab Results: No results found for this or any previous visit (from the past 48 hour(s)).  Blood Alcohol level:  Lab Results  Component Value Date   ETH <10 06/11/2018    Metabolic Disorder Labs: No results found for: HGBA1C, MPG No results found for: PROLACTIN No results found for: CHOL, TRIG, HDL, CHOLHDL, VLDL, LDLCALC  Physical Findings: AIMS: Facial and Oral Movements Muscles of Facial Expression: None, normal Lips and Perioral Area: None, normal Jaw: None, normal Tongue: None, normal,Extremity Movements Upper (arms, wrists, hands, fingers): None, normal Lower (legs, knees, ankles, toes): None, normal, Trunk Movements Neck, shoulders, hips: None, normal, Overall Severity Severity of abnormal movements (highest score from questions above): None, normal Incapacitation due to abnormal movements: None, normal Patient's awareness of abnormal movements (rate only patient's report): No Awareness, Dental Status Current problems with teeth and/or dentures?: No Does patient usually wear dentures?: No  CIWA:  CIWA-Ar Total: 0 COWS:  COWS Total Score: 1  Musculoskeletal: Strength & Muscle Tone: within normal limits Gait & Station: normal Patient leans: N/A  Psychiatric Specialty Exam: Physical Exam  Nursing note and vitals reviewed. Constitutional: He is oriented to person, place, and time. He appears well-developed and well-nourished.  HENT:  Head: Normocephalic and atraumatic.  Respiratory: Effort normal.  Neurological: He is alert and oriented to person, place, and time.    ROS  Blood pressure (!) 87/68, pulse 82, temperature 98 F (36.7 C), temperature source Oral, resp. rate 16, Eaton 5\' 9"  (1.753 m), weight 74.8 kg.Body mass index  is 24.37 kg/m.  General Appearance: Guarded  Eye Contact:  Fair  Speech:  Normal Rate  Volume:  Normal  Mood:  Dysphoric and Irritable  Affect:  Congruent  Thought Process:  Coherent and Descriptions of Associations: Loose  Orientation:  Full (Time, Place, and Person)  Thought Content:  Delusions and Paranoid Ideation  Suicidal Thoughts:  No  Homicidal Thoughts:  No  Memory:  Immediate;   Fair Recent;   Fair Remote;   Fair  Judgement:  Impaired  Insight:  Lacking  Psychomotor Activity:  Increased  Concentration:  Concentration: Fair and Attention Span: Fair  Recall:  [employer] of Knowledge:  Fair  Language:  Fair  Akathisia:  Yes  Handed:  Right  AIMS (if indicated):  Assets:  Desire for Improvement Housing Resilience  ADL's:  Intact  Cognition:  WNL  Sleep:  Number of Hours: 6.25     Treatment Plan Summary: Daily contact with patient to assess and evaluate symptoms and progress in treatment, Medication management and Plan : Patient is seen and examined.  Patient is a 33 year old male with the above-stated past psychiatric history who is seen in follow-up.  He did take some of his medications yesterday and slept better.  He has not taken them today.  He does not believe he needs medications.  I have tried to talk to him about this, and we will switch his Trilafon to Zyprexa.  I am just going to go 2.5 mg p.o. nightly rather than the 5 mg he had been on previously to see if he will at least take it.  We will include the lithium carbonate.  Hopefully he will see the need and getting back on his medications. 1.  Continue lithium carbonate 150 mg p.o. twice daily with meals for mood stability. 2.  Stop Trilafon 3.  Zyprexa 2.5 mg p.o. nightly for mood stability and psychosis. 4.  Temazepam 30 mg p.o. nightly insomnia. 5.  Disposition planning-in progress.  Antonieta Pert, MD 06/16/2018, 12:51 PM

## 2018-06-16 NOTE — [facility] Group Notes (Signed)
[facility] LCSW Group Therapy Note  Date/Time:  06/16/2018  11:00AM-12:00PM  Type of Therapy and Topic:  Group Therapy:  Music and Mood  Participation Level:  Minimal   Description of Group: In this process group, members listened to a variety of genres of music and identified that different types of music evoke different responses.  Patients were encouraged to identify music that was soothing for them and music that was energizing for them.  Patients discussed how this knowledge can help with wellness and recovery in various ways including managing depression and anxiety as well as encouraging healthy sleep habits.    Therapeutic Goals: 1. Patients will explore the impact of different varieties of music on mood 2. Patients will verbalize the thoughts they have when listening to different types of music 3. Patients will identify music that is soothing to them as well as music that is energizing to them 4. Patients will discuss how to use this knowledge to assist in maintaining wellness and recovery 5. Patients will explore the use of music as a coping skill  Summary of Patient Progress:  Patient arrived after group had been going for about 10 minutes, then he left after only about 10 minutes.  He did return for the last 10 minutes of group again.  His affect was flat throughout and he did not respond to any questions about how various songs impacted him.  Therapeutic Modalities: Solution Focused Brief Therapy Activity   Ambrose Mantle, LCSW

## 2018-06-17 MED ORDER — PERPHENAZINE 4 MG PO TABS
4.0000 mg | ORAL_TABLET | Freq: Two times a day (BID) | ORAL | Status: DC
  Administered 2018-06-17 – 2018-06-19 (×6): 4 mg via ORAL
  Filled 2018-06-17 (×11): qty 1

## 2018-06-17 MED ORDER — TEMAZEPAM 15 MG PO CAPS
15.0000 mg | ORAL_CAPSULE | Freq: Every day | ORAL | Status: DC
  Administered 2018-06-17 – 2018-06-25 (×3): 15 mg via ORAL
  Filled 2018-06-17 (×4): qty 1

## 2018-06-17 NOTE — [facility] Group Notes (Signed)
[facility] LCSW Group Therapy Note  Date/Time: 06/17/18, 1300  Type of Therapy and Topic:  Group Therapy:  Overcoming Obstacles  Participation Level:  active  Description of Group:    In this group patients will be encouraged to explore what they see as obstacles to their own wellness and recovery. They will be guided to discuss their thoughts, feelings, and behaviors related to these obstacles. The group will process together ways to cope with barriers, with attention given to specific choices patients can make. Each patient will be challenged to identify changes they are motivated to make in order to overcome their obstacles. This group will be process-oriented, with patients participating in exploration of their own experiences as well as giving and receiving support and challenge from other group members.  Therapeutic Goals: 1. Patient will identify personal and current obstacles as they relate to admission. 2. Patient will identify barriers that currently interfere with their wellness or overcoming obstacles.  3. Patient will identify feelings, thought process and behaviors related to these barriers. 4. Patient will identify two changes they are willing to make to overcome these obstacles:    Summary of Patient Progress: Pt shared that "being misunderstood by my family" is his current obstacle and shared that his family has committed him to mental health facilities before and doesn't understand that he is "fine".  Pt attentive, active in the discussion.  After group, pt asked to speak with CSW and we talked for a few minutes.  CSW told pt that myself and staff here do think that his paranoia is an issue for him.  Pt said we can "disagree" but was calm about it, said he is taking his medication (CSW checked and pt is taking 50% of meds approx)  Good discussion, pt calm, but pt does not show any insight into his diagnosis at this time.       Therapeutic Modalities:   Cognitive Behavioral  Therapy Solution Focused Therapy Motivational Interviewing Relapse Prevention Therapy  Daleen Squibb, LCSW

## 2018-06-17 NOTE — Progress Notes (Signed)
[facility]  06/17/2018 10:20 AM Zachary Eaton  MRN:  [id number] Subjective:    Patient continues to refuse most medication, somewhat paranoid when speaking with other examiners very guarded when speaking with me but very elaborate paranoid delusions when speaking with physicians this weekend. Again when I question him becomes guarded, no EPS or TD denies hallucinations denies wanting to harm self or others asked him frankly if he simply did not remember leaving the home with a weapon he states he does but does not want to talk about it so again he is probably still paranoid. Denies wanting to harm self here or others here Denies hallucinations Very guarded  Principal Problem: Neuropsychiatric sequelae of TBI/PTSD Diagnosis: Active Problems:   MDD (major depressive disorder), severe (HCC)   PTSD (post-traumatic stress disorder)  Total Time spent with patient: 20 minutes  Past Medical History:  Past Medical History:  Diagnosis Date  . Migraines   . Traumatic brain injury [facility](HCC)     Past Surgical History:  Procedure Laterality Date  . ANKLE SURGERY Left    Family History: History reviewed. No pertinent family history.  Social History:  Social History   Substance and Sexual Activity  Alcohol Use Yes   Comment: occ     Social History   Substance and Sexual Activity  Drug Use Not Currently    Social History   Socioeconomic History  . Marital status: Single    Spouse name: Not on file  . Number of children: Not on file  . Years of education: Not on file  . Highest education level: Not on file  Occupational History  . Not on file  Social Needs  . Financial resource strain: Not on file  . Food insecurity:    Worry: Not on file    Inability: Not on file  . Transportation needs:    Medical: Not on file    Non-medical: Not on file  Tobacco Use  . Smoking status: Current Some Day Smoker    Packs/day: 0.25    Types: Cigarettes  . Smokeless tobacco:  Current User    Types: Chew  Substance and Sexual Activity  . Alcohol use: Yes    Comment: occ  . Drug use: Not Currently  . Sexual activity: Not Currently  Lifestyle  . Physical activity:    Days per week: Not on file    Minutes per session: Not on file  . Stress: Not on file  Relationships  . Social connections:    Talks on phone: Not on file    Gets together: Not on file    Attends religious service: Not on file    Active member of club or organization: Not on file    Attends meetings of clubs or organizations: Not on file    Relationship status: Not on file  Other Topics Concern  . Not on file  Social History Narrative  . Not on file   Additional Social History:                         Sleep: Fair  Appetite:  Fair  Current Medications: Current Facility-Administered Medications  Medication Dose Route Frequency Provider Last Rate Last Dose  . lithium carbonate capsule 150 mg  150 mg Oral BID WC Malvin JohnsFarah, Reuel Lamadrid, MD   150 mg at 06/17/18 0902  . ziprasidone (GEODON) injection 20 mg  20 mg Intramuscular Q12H PRN Antonieta Pertlary, Greg Lawson, MD  And  . LORazepam (ATIVAN) tablet 1 mg  1 mg Oral PRN Antonieta Pert, MD      . nicotine polacrilex (NICORETTE) gum 2 mg  2 mg Oral PRN Nira Conn A, NP      . omega-3 acid ethyl esters (LOVAZA) capsule 1 g  1 g Oral BID Malvin Johns, MD   1 g at 06/17/18 0902  . perphenazine (TRILAFON) tablet 4 mg  4 mg Oral BID Malvin Johns, MD      . prenatal multivitamin tablet 1 tablet  1 tablet Oral Q1200 Malvin Johns, MD   1 tablet at 06/16/18 1143  . temazepam (RESTORIL) capsule 30 mg  30 mg Oral QHS Malvin Johns, MD   30 mg at 06/15/18 2116    Lab Results: No results found for this or any previous visit (from the past 48 hour(s)).  Blood Alcohol level:  Lab Results  Component Value Date   ETH <10 06/11/2018    Metabolic Disorder Labs: No results found for: HGBA1C, MPG No results found for: PROLACTIN No results found for:  CHOL, TRIG, HDL, CHOLHDL, VLDL, LDLCALC  Physical Findings: AIMS: Facial and Oral Movements Muscles of Facial Expression: None, normal Lips and Perioral Area: None, normal Jaw: None, normal Tongue: None, normal,Extremity Movements Upper (arms, wrists, hands, fingers): None, normal Lower (legs, knees, ankles, toes): None, normal, Trunk Movements Neck, shoulders, hips: None, normal, Overall Severity Severity of abnormal movements (highest score from questions above): None, normal Incapacitation due to abnormal movements: None, normal Patient's awareness of abnormal movements (rate only patient's report): No Awareness, Dental Status Current problems with teeth and/or dentures?: No Does patient usually wear dentures?: No  CIWA:  CIWA-Ar Total: 0 COWS:  COWS Total Score: 1  Musculoskeletal: Strength & Muscle Tone: within normal limits Gait & Station: normal Patient leans: N/A  Psychiatric Specialty Exam: Physical Exam  ROS  Blood pressure (!) 87/68, pulse 82, temperature 98 F (36.7 C), temperature source Oral, resp. rate 16, height 5\' 9"  (1.753 m), weight 74.8 kg.Body mass index is 24.37 kg/m.  General Appearance: groomed  Eye Contact:  Fair  Speech:  Clear and Coherent  Volume:  Normal  Mood:  Dysphoric  Affect:  Blunt  Thought Process:  PI  Orientation:  Full (Time, Place, and Person)  Thought Content:  Delusions and Tangential  Suicidal Thoughts:  No  Homicidal Thoughts:  No  Memory:  Immediate;   Fair  Judgement:  Impaired  Insight:  Lacking  Psychomotor Activity:  Decreased  Concentration:  Concentration: Fair  Recall:  [employer] of Knowledge:  Fair  Language:  Fair  Akathisia:  Negative  Handed:  Right  AIMS (if indicated):     Assets:  Leisure Time Physical Health Resilience  ADL's:  Intact  Cognition:  WNL  Sleep:  Number of Hours: 4     Treatment Plan Summary: Daily contact with patient to assess and evaluate symptoms and progress in treatment,  Medication management and Plan Patient encouraged towards med compliance continue cognitive and reality based therapy, continue explore paranoia, replace Zyprexa which he does not want to take with low-dose perphenazine continue current groups and precautions  Valor Quaintance, MD 06/17/2018, 10:20 AM

## 2018-06-17 NOTE — Progress Notes (Signed)
Recreation Therapy Notes  Date: 2.3.20 Time: 1000 Location: 500 Hall Dayroom  Group Topic: Anxiety  Goal Area(s) Addresses:  Patient will identify triggers for anxiety.  Patient will identify physical symptoms to anxiety.  Patient will identify coping skills to deal with anxiety.   Intervention:  Worksheet, pencils  Activity:  Introduction to Anxiety.  Patients were to identify at least 3 things that trigger anxiety, physical symptoms they have when anxious, thoughts they have when anxious and coping skills the use to deal with anxiety.  Education: Anger Management, Discharge Planning   Education Outcome: Acknowledges education/In group clarification offered/Needs additional education.   Clinical Observations/Feedback: Patient did not attend group.      Caroll Rancher, LRT/CTRS     Caroll Rancher A 06/17/2018 11:37 AM

## 2018-06-17 NOTE — Progress Notes (Signed)
D: Pt denies SI/HI/AVH. Pt is pleasant and cooperative. Pt visible on unit this evening. Pt seen visiting with parents, then seen in dayroom some of the night. Pt state he did not like the 30 of Restoril due to it making him sleep too hard, pt asked if he would be willing to try 15 mg , per Jason-NP order was changed to 15 mg. Pt encouraged to let the doctor know the result. Pt conversations this evening were very coherent and logical. Pt stated he was "trying " to do right.    A: Pt was offered support and encouragement. Pt was given scheduled medications. Pt was encourage to attend groups. Q 15 minute checks were done for safety.   R:Pt attends groups and interacts well with peers and staff. Pt is taking medication. Pt has no complaints.Pt receptive to treatment and safety maintained on unit.  Problem: Education: Goal: Emotional status will improve Outcome: Progressing   Problem: Education: Goal: Mental status will improve Outcome: Progressing   Problem: Education: Goal: Verbalization of understanding the information provided will improve Outcome: Progressing   Problem: Activity: Goal: Interest or engagement in activities will improve Outcome: Progressing   Problem: Activity: Goal: Sleeping patterns will improve Outcome: Progressing   Problem: Coping: Goal: Ability to demonstrate self-control will improve Outcome: Progressing   Problem: Coping: Goal: Coping ability will improve Outcome: Progressing

## 2018-06-18 MED ORDER — DIVALPROEX SODIUM 125 MG PO CSDR
125.0000 mg | DELAYED_RELEASE_CAPSULE | Freq: Two times a day (BID) | ORAL | Status: DC
  Administered 2018-06-19 – 2018-06-22 (×2): 125 mg via ORAL
  Filled 2018-06-18 (×14): qty 1

## 2018-06-18 MED ORDER — LITHIUM CARBONATE 150 MG PO CAPS
150.0000 mg | ORAL_CAPSULE | Freq: Every day | ORAL | Status: DC
  Administered 2018-06-24 – 2018-06-25 (×2): 150 mg via ORAL
  Filled 2018-06-18 (×10): qty 1

## 2018-06-18 NOTE — Progress Notes (Signed)
[facility] MD Progress Note  06/18/2018 9:11 AM Zachary Eaton  MRN:  [id number] Subjective:   Patient reports staff reports improved compliance Patient continues to minimize or speak vaguely about issues leading to hospitalization and continues to minimize symptoms but is cordial and appropriate although guarded and minimally engaged. Eye contact fair Vital stable with no involuntary movements Denying thoughts of harming self or others denying acute psychosis  Principal Problem: Neuropsychiatric sequelae of TBI Diagnosis: Active Problems:   MDD (major depressive disorder), severe (HCC)   PTSD (post-traumatic stress disorder)  Total Time spent with patient: 20 minutes  Past Medical History:  Past Medical History:  Diagnosis Date  . Migraines   . Traumatic brain injury [facility])     Past Surgical History:  Procedure Laterality Date  . ANKLE SURGERY Left    Family History: History reviewed. No pertinent family history.  Social History:  Social History   Substance and Sexual Activity  Alcohol Use Yes   Comment: occ     Social History   Substance and Sexual Activity  Drug Use Not Currently    Social History   Socioeconomic History  . Marital status: Single    Spouse name: Not on file  . Number of children: Not on file  . Years of education: Not on file  . Highest education level: Not on file  Occupational History  . Not on file  Social Needs  . Financial resource strain: Not on file  . Food insecurity:    Worry: Not on file    Inability: Not on file  . Transportation needs:    Medical: Not on file    Non-medical: Not on file  Tobacco Use  . Smoking status: Current Some Day Smoker    Packs/day: 0.25    Types: Cigarettes  . Smokeless tobacco: Current User    Types: Chew  Substance and Sexual Activity  . Alcohol use: Yes    Comment: occ  . Drug use: Not Currently  . Sexual activity: Not Currently  Lifestyle  . Physical activity:    Days per week: Not on file     Minutes per session: Not on file  . Stress: Not on file  Relationships  . Social connections:    Talks on phone: Not on file    Gets together: Not on file    Attends religious service: Not on file    Active member of club or organization: Not on file    Attends meetings of clubs or organizations: Not on file    Relationship status: Not on file  Other Topics Concern  . Not on file  Social History Narrative  . Not on file   Additional Social History:                         Sleep: Fair  Appetite:  Fair  Current Medications: Current Facility-Administered Medications  Medication Dose Route Frequency Provider Last Rate Last Dose  . lithium carbonate capsule 150 mg  150 mg Oral BID WC Malvin Johns, MD   150 mg at 06/18/18 0802  . ziprasidone (GEODON) injection 20 mg  20 mg Intramuscular Q12H PRN Antonieta Pert, MD       And  . LORazepam (ATIVAN) tablet 1 mg  1 mg Oral PRN Antonieta Pert, MD      . nicotine polacrilex (NICORETTE) gum 2 mg  2 mg Oral PRN Jackelyn Poling, NP      .  omega-3 acid ethyl esters (LOVAZA) capsule 1 g  1 g Oral BID Malvin Johns, MD   1 g at 06/18/18 0802  . perphenazine (TRILAFON) tablet 4 mg  4 mg Oral BID Malvin Johns, MD   4 mg at 06/18/18 0802  . prenatal multivitamin tablet 1 tablet  1 tablet Oral Q1200 Malvin Johns, MD   1 tablet at 06/17/18 1151  . temazepam (RESTORIL) capsule 15 mg  15 mg Oral QHS Nira Conn A, NP   15 mg at 06/17/18 2140    Lab Results: No results found for this or any previous visit (from the past 48 hour(s)).  Blood Alcohol level:  Lab Results  Component Value Date   ETH <10 06/11/2018    Metabolic Disorder Labs: No results found for: HGBA1C, MPG No results found for: PROLACTIN No results found for: CHOL, TRIG, HDL, CHOLHDL, VLDL, LDLCALC  Physical Findings: AIMS: Facial and Oral Movements Muscles of Facial Expression: None, normal Lips and Perioral Area: None, normal Jaw: None, normal Tongue:  None, normal,Extremity Movements Upper (arms, wrists, hands, fingers): None, normal Lower (legs, knees, ankles, toes): None, normal, Trunk Movements Neck, shoulders, hips: None, normal, Overall Severity Severity of abnormal movements (highest score from questions above): None, normal Incapacitation due to abnormal movements: None, normal Patient's awareness of abnormal movements (rate only patient's report): No Awareness, Dental Status Current problems with teeth and/or dentures?: No Does patient usually wear dentures?: No  CIWA:  CIWA-Ar Total: 0 COWS:  COWS Total Score: 1  Musculoskeletal: Strength & Muscle Tone: within normal limits Gait & Station: normal Patient leans: N/A  Psychiatric Specialty Exam: Physical Exam  ROS  Blood pressure (!) 87/68, pulse 82, temperature 98 F (36.7 C), temperature source Oral, resp. rate 16, height 5\' 9"  (1.753 m), weight 74.8 kg.Body mass index is 24.37 kg/m.  General Appearance:nl  Eye Contact:  Good  Speech:  Clear and Coherent  Volume:  Decreased  Mood:  Anxious and Dysphoric  Affect:  Blunt  Thought Process:  Linear  Orientation:  Full (Time, Place, and Person)  Thought Content:  Logical  Suicidal Thoughts:  No  Homicidal Thoughts:  No  Memory:  Immediate;   Fair  Judgement:  Fair  Insight:  lacks  Psychomotor Activity:  Decreased  Concentration:  Concentration: Fair  Recall:  [employer] of Knowledge:  Fair  Language:  Fair  Akathisia:  Negative  Handed:  Right  AIMS (if indicated):     Assets:  Physical Health Resilience  ADL's:  Intact  Cognition:  WNL  Sleep:  Number of Hours: 4.75     Treatment Plan Summary: Daily contact with patient to assess and evaluate symptoms and progress in treatment, Medication management and Plan Continue current measures continue cognitive-based therapy continue explore options post discharge continue reality-based therapy neuroprotective measures low-dose lithium and low-dose perphenazine  for manic symptoms  Josephyne Tarter, MD 06/18/2018, 9:11 AM

## 2018-06-18 NOTE — Progress Notes (Signed)
CSW spoke with Dr Alphonsus Sias at [facility] clinic again to inquire about treatment options outside the [place].  Dr Alphonsus Sias reports this would have to be determined by the mental health clinic and approval would depend on the reasons outside treatment was being requested.  Pt would just have to go through the process.   Current PTSD treatment involves outpt therapy with PTSD approved treatments and there are also PTSD groups that meet at [facility].  [facility] [place] does have an inpt program for PTSD, length is significant amount of time, around 12 weeks.  This would require an application and acceptance process for pt to join the inpt program.   Garner Nash, MSW, LCSW Clinical Social Worker 06/18/2018 1:20 PM

## 2018-06-18 NOTE — Progress Notes (Signed)
Recreation Therapy Notes  Date: 2.4.20 Time: 1000 Location: 500 Hall Dayroom  Group Topic: Wellness  Goal Area(s) Addresses:  Patient will define components of whole wellness. Patient will verbalize benefit of whole wellness.  Behavioral Response: None  Intervention: Exercise, Music  Activity: Exercise.  LRT introduced the concept of wellness to patients.  LRT and patients then engaged in a series of stretches and exercises.  Patients took turns leading group in exercises of their choosing.  Patients could take breaks and get water as needed.  Education: Wellness, Building control surveyor.   Education Outcome: Acknowledges education/In group clarification offered/Needs additional education.   Clinical Observations/Feedback:  Pt came to group but left before the exercises started and did not return.    Caroll Rancher, LRT/CTRS         Lillia Abed, Mattilyn Crites A 06/18/2018 11:10 AM

## 2018-06-18 NOTE — Progress Notes (Signed)
CSW spoke with Dr Alphonsus SiasSpriggs [phone]) [facility].  Pt has not yet been referred to the mental health clinic, which needs to happen based on bipolar diagnosis.  An actual appt would be some time into the future, so Dr Alphonsus SiasSpriggs requests that pt attend walk in appt at [place] [facility] clinic, who can then refer pt to mental health clinic.  Monday-Friday, 9am-330pm--pt needs to show up early as possible.  Garner NashGregory Adelfo Diebel, MSW, LCSW Clinical Social Worker 06/18/2018 1:18 PM

## 2018-06-18 NOTE — Progress Notes (Signed)
Patient ID: Zachary Eaton, male   DOB: 09-02-1985, 33 y.o.   MRN: [id number]   D: Patient denies SI/HI and auditory and visual hallucinations.Patient refusing Depakote sprinkles until he talks further with MD about this medication. Minimal interaction with peers. Affect flat, minimal eye contact.  A: Patient given emotional support from RN. Patient given medications per MD orders. Patient encouraged to attend groups and unit activities. Patient encouraged to come to staff with any questions or concerns.  R: Patient remains cooperative and appropriate. Will continue to monitor patient for safety.

## 2018-06-18 NOTE — [facility] Suicide Risk Assessment (Signed)
[facility] INPATIENT:  Family/Significant Other Suicide Prevention Education  Suicide Prevention Education:  Education Completed; Carlis Kipps, mother, [phone], has been identified by the patient as the family member/significant other with whom the patient will be residing, and identified as the person(s) who will aid the patient in the event of a mental health crisis (suicidal ideations/suicide attempt).  With written consent from the patient, the family member/significant other has been provided the following suicide prevention education, prior to the and/or following the discharge of the patient.  The suicide prevention education provided includes the following:  Suicide risk factors  Suicide prevention and interventions  National Suicide Hotline telephone number  [facility] assessment telephone number  [facility] Emergency Assistance 911  [facility] and/or Residential Mobile Crisis Unit telephone number  Request made of family/significant other to:  Remove weapons (e.g., guns, rifles, knives), all items previously/currently identified as safety concern.  Shawna Orleans reports guns have been removed and taken to Melanie's father's home.  Remove drugs/medications (over-the-counter, prescriptions, illicit drugs), all items previously/currently identified as a safety concern.  The family member/significant other verbalizes understanding of the suicide prevention education information provided.  The family member/significant other agrees to remove the items of safety concern listed above.  Shawna Orleans reports she spoke with Dr Jeannine Kitten this AM, who had spoken to [facility] MD and the consensus was that due to [facility] not having substance use issues it would not be a good fit.  Shawna Orleans reports Sharia Reeve does not trust [facility] provider due to them IVCing him too--she wondered if there are non-VA providers he can see?    Lorri Frederick, LCSW 06/18/2018, 12:11 PM

## 2018-06-18 NOTE — Plan of Care (Signed)
Mother confirms that safe of guns has been removed from his access/from house as he has refused to give up combination to safe-

## 2018-06-19 MED ORDER — ARIPIPRAZOLE 2 MG PO TABS
2.0000 mg | ORAL_TABLET | Freq: Once | ORAL | Status: AC
  Administered 2018-06-19: 2 mg via ORAL
  Filled 2018-06-19: qty 1

## 2018-06-19 NOTE — Progress Notes (Signed)
Pt refused his medications , " I'm good" . Pt appears to increasingly get more paranoid during the evening.

## 2018-06-19 NOTE — Progress Notes (Signed)
D: Pt denies SI/HI/AVH. Pt is pleasant and cooperative. Pt has been in room majority of evening, pt continues to be paranoid.   A: Pt was offered support and encouragement.  Pt was encourage to attend groups. Q 15 minute checks were done for safety.  R: safety maintained on unit.  Problem: Activity: Goal: Sleeping patterns will improve Outcome: Progressing

## 2018-06-19 NOTE — Progress Notes (Signed)
D: Pt denies SI/HI/AVH. Pt is paranoid, but visible on the unit at times. Pt was under the impression he took all his medications today. Pt was informed he forgot to take his Depakote sprinkles. Pt stated"I'm good"  A: Pt was offered support and encouragement. . Pt was encourage to attend groups. Q 15 minute checks were done for safety.   R: safety maintained on unit.  Problem: Activity: Goal: Interest or engagement in activities will improve Outcome: Progressing   Problem: Coping: Goal: Ability to demonstrate self-control will improve Outcome: Progressing   Problem: Coping: Goal: Coping ability will improve Outcome: Progressing   Problem: Coping: Goal: Will verbalize feelings Outcome: Progressing

## 2018-06-19 NOTE — Tx Team (Signed)
Interdisciplinary Treatment and Diagnostic Plan Update  06/19/2018 Time of Session: 0855 AM  Zachary Eaton MRN: [id number]  Principal Diagnosis: <principal problem not specified>  Secondary Diagnoses: Active Problems:   MDD (major depressive disorder), severe (HCC)   PTSD (post-traumatic stress disorder)   Current Medications:  Current Facility-Administered Medications  Medication Dose Route Frequency Provider Last Rate Last Dose  . divalproex (DEPAKOTE SPRINKLE) capsule 125 mg  125 mg Oral Q12H Malvin Johns, MD   125 mg at 06/19/18 0840  . lithium carbonate capsule 150 mg  150 mg Oral QHS Malvin Johns, MD      . ziprasidone (GEODON) injection 20 mg  20 mg Intramuscular Q12H PRN Antonieta Pert, MD       And  . LORazepam (ATIVAN) tablet 1 mg  1 mg Oral PRN Antonieta Pert, MD      . nicotine polacrilex (NICORETTE) gum 2 mg  2 mg Oral PRN Nira Conn A, NP      . omega-3 acid ethyl esters (LOVAZA) capsule 1 g  1 g Oral BID Malvin Johns, MD   1 g at 06/19/18 0840  . perphenazine (TRILAFON) tablet 4 mg  4 mg Oral BID Malvin Johns, MD   4 mg at 06/19/18 0840  . prenatal multivitamin tablet 1 tablet  1 tablet Oral Q1200 Malvin Johns, MD   1 tablet at 06/19/18 1146  . temazepam (RESTORIL) capsule 15 mg  15 mg Oral QHS Nira Conn A, NP   15 mg at 06/17/18 2140   PTA Medications: No medications prior to admission.    Patient Stressors: Paediatric nurse issue Marital or family conflict  Patient Strengths: Ability for insight Average or above average intelligence Capable of independent living General fund of knowledge Supportive family/friends  Treatment Modalities: Medication Management, Group therapy, Case management,  1 to 1 session with clinician, Psychoeducation, Recreational therapy.   Physician Treatment Plan for Primary Diagnosis: <principal problem not specified> Long Term Goal(s): Improvement in symptoms so as ready for discharge Improvement in  symptoms so as ready for discharge   Short Term Goals: Ability to maintain clinical measurements within normal limits will improve Compliance with prescribed medications will improve Ability to identify triggers associated with substance abuse/mental health issues will improve Ability to maintain clinical measurements within normal limits will improve Compliance with prescribed medications will improve Ability to identify triggers associated with substance abuse/mental health issues will improve  Medication Management: Evaluate patient's response, side effects, and tolerance of medication regimen.  Therapeutic Interventions: 1 to 1 sessions, Unit Group sessions and Medication administration.  Evaluation of Outcomes: Progressing  Physician Treatment Plan for Secondary Diagnosis: Active Problems:   MDD (major depressive disorder), severe (HCC)   PTSD (post-traumatic stress disorder)  Long Term Goal(s): Improvement in symptoms so as ready for discharge Improvement in symptoms so as ready for discharge   Short Term Goals: Ability to maintain clinical measurements within normal limits will improve Compliance with prescribed medications will improve Ability to identify triggers associated with substance abuse/mental health issues will improve Ability to maintain clinical measurements within normal limits will improve Compliance with prescribed medications will improve Ability to identify triggers associated with substance abuse/mental health issues will improve     Medication Management: Evaluate patient's response, side effects, and tolerance of medication regimen.  Therapeutic Interventions: 1 to 1 sessions, Unit Group sessions and Medication administration.  Evaluation of Outcomes: Progressing   RN Treatment Plan for Primary Diagnosis: <principal problem not specified> Long Term  Goal(s): Knowledge of disease and therapeutic regimen to maintain health will improve  Short Term  Goals: Ability to identify and develop effective coping behaviors will improve and Compliance with prescribed medications will improve  Medication Management: RN will administer medications as ordered by provider, will assess and evaluate patient's response and provide education to patient for prescribed medication. RN will report any adverse and/or side effects to prescribing provider.  Therapeutic Interventions: 1 on 1 counseling sessions, Psychoeducation, Medication administration, Evaluate responses to treatment, Monitor vital signs and CBGs as ordered, Perform/monitor CIWA, COWS, AIMS and Fall Risk screenings as ordered, Perform wound care treatments as ordered.  Evaluation of Outcomes: Progressing   LCSW Treatment Plan for Primary Diagnosis: <principal problem not specified> Long Term Goal(s): Safe transition to appropriate next level of care at discharge, Engage patient in therapeutic group addressing interpersonal concerns.  Short Term Goals: Engage patient in aftercare planning with referrals and resources, Increase social support and Increase skills for wellness and recovery  Therapeutic Interventions: Assess for all discharge needs, 1 to 1 time with Social worker, Explore available resources and support systems, Assess for adequacy in community support network, Educate family and significant other(s) on suicide prevention, Complete Psychosocial Assessment, Interpersonal group therapy.  Evaluation of Outcomes: Progressing   Progress in Treatment: Attending groups: Yes. Participating in groups: Yes. Taking medication as prescribed: Yes. Toleration medication: Yes. Family/Significant other contact made: Yes, with parents.  Patient understands diagnosis: No. Discussing patient identified problems/goals with staff: No. Medical problems stabilized or resolved: No. Denies suicidal/homicidal ideation: Yes. Issues/concerns per patient self-inventory: No. Other:   New problem(s)  identified: No, Describe:  None reported  New Short Term/Long Term Goal(s):   Patient Goals:  "Get back to feeling better"  Discharge Plan or Barriers: Access to transportation  Reason for Continuation of Hospitalization: Aggression Depression Delusions Homicidal ideation Medical Issues, TBI Medication stabilization Other; describe PTSD severe  Estimated Length of Stay: 2-4 days   Attendees: Patient: 06/19/2018   Physician: Dr Jeannine KittenFarah, MD 06/19/2018   Nursing: Norma FredricksonMichael Scearce, RN 06/19/2018   RN Care Manager: 06/19/2018   Social Worker: Daleen SquibbGreg Lavonya Hoerner, LCSW 06/19/2018  Recreational Therapist:  06/19/2018   Other:  06/19/2018   Other:  06/19/2018   Other: 06/19/2018       Scribe for Treatment Team: Wyn QuakerWierda, Olia Hinderliter Jon, LCSW, MSW Intern 06/19/2018 3:54 PM

## 2018-06-19 NOTE — Progress Notes (Addendum)
CSW received phone calls from two [place] staff: Salvadore Dom, social worker, [place]: [phone], [phone], pager: [phone] April: Transfer Coordinator: [phone]  CSW returned calls to both, spoke with April who immediately said no beds available and then declined CSW offer to begin sending information for them to review regarding possible admission.    CSW spoke with pt mother and updated her on [place] status.  She is going to call the senator again to see if they can help with getting pt into the [place]. Garner Nash, MSW, LCSW Clinical Social Worker 06/19/2018 2:01 PM   CSW did speak with Salvadore Dom at [facility]--she can request transfer to [place] but not a lot more than that.  Will stay in touch. Garner Nash, MSW, LCSW Clinical Social Worker 06/20/2018 9:54 AM

## 2018-06-19 NOTE — Progress Notes (Signed)
Did not attend group 

## 2018-06-19 NOTE — [facility] Group Notes (Signed)
[facility] LCSW Group Therapy Note  Date/Time: 06/18/18, 1100  Type of Therapy/Topic:  Group Therapy:  Feelings about Diagnosis  Participation Level:  Did Not Attend   Mood:   Description of Group:    This group will allow patients to explore their thoughts and feelings about diagnoses they have received. Patients will be guided to explore their level of understanding and acceptance of these diagnoses. Facilitator will encourage patients to process their thoughts and feelings about the reactions of others to their diagnosis, and will guide patients in identifying ways to discuss their diagnosis with significant others in their lives. This group will be process-oriented, with patients participating in exploration of their own experiences as well as giving and receiving support and challenge from other group members.   Therapeutic Goals: 1. Patient will demonstrate understanding of diagnosis as evidence by identifying two or more symptoms of the disorder:  2. Patient will be able to express two feelings regarding the diagnosis 3. Patient will demonstrate ability to communicate their needs through discussion and/or role plays  Summary of Patient Progress:        Therapeutic Modalities:   Cognitive Behavioral Therapy Brief Therapy Feelings Identification   Greg Matia Zelada, LCSW 

## 2018-06-19 NOTE — Progress Notes (Signed)
[facility] MD Progress Note  06/19/2018 10:14 AM Leontine LocketJoshua D Lyman  MRN:  [id number] Subjective:    Patient spoke with mother at length demanding lawyers demanding release, showing lack of insight, more paranoia yesterday.  Patient himself is guarded and minimally talkative to me and denies all symptoms when screened.  Alert and oriented to person place and situation but very guarded. Still paranoid refusing meds so forth Test dose of aripiprazole in order to give long-acting injectable   Principal Problem: Bipolar in the context of TBI's/recent dangerousness Diagnosis: Active Problems:   MDD (major depressive disorder), severe (HCC)   PTSD (post-traumatic stress disorder)  Total Time spent with patient: 20 minutes  Past Medical History:  Past Medical History:  Diagnosis Date  . Migraines   . Traumatic brain injury [facility](HCC)     Past Surgical History:  Procedure Laterality Date  . ANKLE SURGERY Left    Family History: History reviewed. No pertinent family history. Family Psychiatric  History: father BPAD Social History:  Social History   Substance and Sexual Activity  Alcohol Use Yes   Comment: occ     Social History   Substance and Sexual Activity  Drug Use Not Currently    Social History   Socioeconomic History  . Marital status: Single    Spouse name: Not on file  . Number of children: Not on file  . Years of education: Not on file  . Highest education level: Not on file  Occupational History  . Not on file  Social Needs  . Financial resource strain: Not on file  . Food insecurity:    Worry: Not on file    Inability: Not on file  . Transportation needs:    Medical: Not on file    Non-medical: Not on file  Tobacco Use  . Smoking status: Current Some Day Smoker    Packs/day: 0.25    Types: Cigarettes  . Smokeless tobacco: Current User    Types: Chew  Substance and Sexual Activity  . Alcohol use: Yes    Comment: occ  . Drug use: Not Currently  . Sexual  activity: Not Currently  Lifestyle  . Physical activity:    Days per week: Not on file    Minutes per session: Not on file  . Stress: Not on file  Relationships  . Social connections:    Talks on phone: Not on file    Gets together: Not on file    Attends religious service: Not on file    Active member of club or organization: Not on file    Attends meetings of clubs or organizations: Not on file    Relationship status: Not on file  Other Topics Concern  . Not on file  Social History Narrative  . Not on file   Additional Social History:                         Sleep: Fair  Appetite:  Fair  Current Medications: Current Facility-Administered Medications  Medication Dose Route Frequency Provider Last Rate Last Dose  . divalproex (DEPAKOTE SPRINKLE) capsule 125 mg  125 mg Oral Q12H Malvin JohnsFarah, Dylyn Mclaren, MD   125 mg at 06/19/18 0840  . lithium carbonate capsule 150 mg  150 mg Oral QHS Malvin JohnsFarah, Jamaine Quintin, MD      . ziprasidone (GEODON) injection 20 mg  20 mg Intramuscular Q12H PRN Antonieta Pertlary, Greg Lawson, MD       And  . LORazepam (ATIVAN)  tablet 1 mg  1 mg Oral PRN Antonieta Pert, MD      . nicotine polacrilex (NICORETTE) gum 2 mg  2 mg Oral PRN Nira Conn A, NP      . omega-3 acid ethyl esters (LOVAZA) capsule 1 g  1 g Oral BID Malvin Johns, MD   1 g at 06/19/18 0840  . perphenazine (TRILAFON) tablet 4 mg  4 mg Oral BID Malvin Johns, MD   4 mg at 06/19/18 0840  . prenatal multivitamin tablet 1 tablet  1 tablet Oral Q1200 Malvin Johns, MD   1 tablet at 06/18/18 1205  . temazepam (RESTORIL) capsule 15 mg  15 mg Oral QHS Nira Conn A, NP   15 mg at 06/17/18 2140    Lab Results: No results found for this or any previous visit (from the past 48 hour(s)).  Blood Alcohol level:  Lab Results  Component Value Date   ETH <10 06/11/2018    Metabolic Disorder Labs: No results found for: HGBA1C, MPG No results found for: PROLACTIN No results found for: CHOL, TRIG, HDL, CHOLHDL,  VLDL, LDLCALC  Physical Findings: AIMS: Facial and Oral Movements Muscles of Facial Expression: None, normal Lips and Perioral Area: None, normal Jaw: None, normal Tongue: None, normal,Extremity Movements Upper (arms, wrists, hands, fingers): None, normal Lower (legs, knees, ankles, toes): None, normal, Trunk Movements Neck, shoulders, hips: None, normal, Overall Severity Severity of abnormal movements (highest score from questions above): None, normal Incapacitation due to abnormal movements: None, normal Patient's awareness of abnormal movements (rate only patient's report): No Awareness, Dental Status Current problems with teeth and/or dentures?: No Does patient usually wear dentures?: No  CIWA:  CIWA-Ar Total: 0 COWS:  COWS Total Score: 1  Musculoskeletal: Strength & Muscle Tone: within normal limits Gait & Station: normal Patient leans: N/A  Psychiatric Specialty Exam: Physical Exam  ROS  Blood pressure (!) 87/68, pulse 82, temperature 98 F (36.7 C), temperature source Oral, resp. rate 16, height 5\' 9"  (1.753 m), weight 74.8 kg.Body mass index is 24.37 kg/m.  General Appearance: Casual  Eye Contact:  Minimal  Speech: slow  Volume:  Decreased  Mood:  Anxious and Dysphoric  Affect:  Blunt and Congruent  Thought Process:  Linear  Orientation:  Full (Time, Place, and Person)  Thought Content:  Paranoid Ideation and Tangential  Suicidal Thoughts:  No  Homicidal Thoughts:  No  Memory:  Immediate;   Fair  Judgement:  Fair  Insight:  Fair  Psychomotor Activity:  Decreased  Concentration:  Concentration: Fair  Recall:  [employer] of Knowledge:  Fair  Language:  Fair  Akathisia:  Negative  Handed:  Right  AIMS (if indicated):     Assets:  Leisure Time Physical Health Resilience  ADL's:  Intact  Cognition:  WNL  Sleep:  Number of Hours: 2.75     Treatment Plan Summary: Daily contact with patient to assess and evaluate symptoms and progress in treatment,  Medication management and Plan Dose of aripiprazole in anticipation of long-acting injectable, continue to encourage compliance, continue reality-based therapy  Syrenity Klepacki, MD 06/19/2018, 10:14 AM

## 2018-06-19 NOTE — Plan of Care (Signed)
  Problem: Safety: Goal: Periods of time without injury will increase Outcome: Progressing  DAR NOTE: Patient presents with anxious affect and mood.  Denies suicidal thoughts, pain, auditory and visual hallucinations.  Rates depression at 1, hopelessness at 0, and anxiety at 0.  Maintained on routine safety checks.  Needed a lot of encouragement before taking his prescribed medications.   Support and encouragement offered as needed.  Attended group and participated.  States goal for today is "leave."  Patient isolates to his room with minimal interactions.  Patient is safe on the unit.

## 2018-06-20 NOTE — Plan of Care (Signed)
  Problem: Activity: Goal: Interest or engagement in activities will improve Outcome: Not Progressing   Problem: Health Behavior/Discharge Planning: Goal: Compliance with treatment plan for underlying cause of condition will improve Outcome: Not Progressing  Dar Note: Patient presents with irritable affect and mood.  Remained in his room for majority of this shift.  Refused to participate in treatment plan of care including medication.  MD made aware.  Staff continues to offer support and encouragement as needed.  Routine safety checks maintained every 15 minutes.  Patient is safe on the unit.

## 2018-06-20 NOTE — Progress Notes (Signed)
Did not attend group 

## 2018-06-20 NOTE — Progress Notes (Signed)
CSW LM with April, transfer coordinator at [facility] requesting information on bed availability for today. Garner Nash, MSW, LCSW Clinical Social Worker 06/20/2018 9:54 AM

## 2018-06-20 NOTE — Progress Notes (Signed)
[facility] MD Progress Note  06/20/2018 9:38 AM Zachary Eaton  MRN:  [id number] Subjective:   Patient still minimally talkative minimally engaged and denying all symptoms when I interview him however he is not taking his medication which is the main hang up.  There was clear danger prior to admission and he needs this bipolar type condition and this paranoia medicated so we simply cannot release him yet.  Once again med compliance is discussed in detail. No EPS or TD Principal Problem: Bipolar symptomatology, recent homicidal threats in the context of 3 severe TBI's Diagnosis: Active Problems:   MDD (major depressive disorder), severe (HCC)   PTSD (post-traumatic stress disorder)  Total Time spent with patient: 20 minutes  Past Medical History:  Past Medical History:  Diagnosis Date  . Migraines   . Traumatic brain injury [facility])     Past Surgical History:  Procedure Laterality Date  . ANKLE SURGERY Left    Family History: History reviewed. No pertinent family history.  Social History:  Social History   Substance and Sexual Activity  Alcohol Use Yes   Comment: occ     Social History   Substance and Sexual Activity  Drug Use Not Currently    Social History   Socioeconomic History  . Marital status: Single    Spouse name: Not on file  . Number of children: Not on file  . Years of education: Not on file  . Highest education level: Not on file  Occupational History  . Not on file  Social Needs  . Financial resource strain: Not on file  . Food insecurity:    Worry: Not on file    Inability: Not on file  . Transportation needs:    Medical: Not on file    Non-medical: Not on file  Tobacco Use  . Smoking status: Current Some Day Smoker    Packs/day: 0.25    Types: Cigarettes  . Smokeless tobacco: Current User    Types: Chew  Substance and Sexual Activity  . Alcohol use: Yes    Comment: occ  . Drug use: Not Currently  . Sexual activity: Not Currently  Lifestyle   . Physical activity:    Days per week: Not on file    Minutes per session: Not on file  . Stress: Not on file  Relationships  . Social connections:    Talks on phone: Not on file    Gets together: Not on file    Attends religious service: Not on file    Active member of club or organization: Not on file    Attends meetings of clubs or organizations: Not on file    Relationship status: Not on file  Other Topics Concern  . Not on file  Social History Narrative  . Not on file   Additional Social History:                         Sleep: Fair  Appetite:  Fair  Current Medications: Current Facility-Administered Medications  Medication Dose Route Frequency Provider Last Rate Last Dose  . divalproex (DEPAKOTE SPRINKLE) capsule 125 mg  125 mg Oral Q12H Malvin Johns, MD   125 mg at 06/19/18 0840  . lithium carbonate capsule 150 mg  150 mg Oral QHS Malvin Johns, MD      . ziprasidone (GEODON) injection 20 mg  20 mg Intramuscular Q12H PRN Antonieta Pert, MD       And  .  LORazepam (ATIVAN) tablet 1 mg  1 mg Oral PRN Antonieta Pertlary, Greg Lawson, MD      . nicotine polacrilex (NICORETTE) gum 2 mg  2 mg Oral PRN Nira ConnBerry, Jason A, NP      . omega-3 acid ethyl esters (LOVAZA) capsule 1 g  1 g Oral BID Malvin JohnsFarah, Reata Petrov, MD   1 g at 06/19/18 1647  . perphenazine (TRILAFON) tablet 4 mg  4 mg Oral BID Malvin JohnsFarah, Herchel Hopkin, MD   4 mg at 06/19/18 1647  . prenatal multivitamin tablet 1 tablet  1 tablet Oral Q1200 Malvin JohnsFarah, Liley Rake, MD   1 tablet at 06/19/18 1146  . temazepam (RESTORIL) capsule 15 mg  15 mg Oral QHS Nira ConnBerry, Jason A, NP   15 mg at 06/17/18 2140    Lab Results: No results found for this or any previous visit (from the past 48 hour(s)).  Blood Alcohol level:  Lab Results  Component Value Date   ETH <10 06/11/2018    Metabolic Disorder Labs: No results found for: HGBA1C, MPG No results found for: PROLACTIN No results found for: CHOL, TRIG, HDL, CHOLHDL, VLDL, LDLCALC  Physical  Findings: AIMS: Facial and Oral Movements Muscles of Facial Expression: None, normal Lips and Perioral Area: None, normal Jaw: None, normal Tongue: None, normal,Extremity Movements Upper (arms, wrists, hands, fingers): None, normal Lower (legs, knees, ankles, toes): None, normal, Trunk Movements Neck, shoulders, hips: None, normal, Overall Severity Severity of abnormal movements (highest score from questions above): None, normal Incapacitation due to abnormal movements: None, normal Patient's awareness of abnormal movements (rate only patient's report): No Awareness, Dental Status Current problems with teeth and/or dentures?: No Does patient usually wear dentures?: No  CIWA:  CIWA-Ar Total: 0 COWS:  COWS Total Score: 1  Musculoskeletal: Strength & Muscle Tone: within normal limits Gait & Station: normal Patient leans: N/A  Psychiatric Specialty Exam: Physical Exam  ROS  Blood pressure (!) 87/68, pulse 82, temperature 98 F (36.7 C), temperature source Oral, resp. rate 16, height 5\' 9"  (1.753 m), weight 74.8 kg.Body mass index is 24.37 kg/m.  General Appearance: Casual  Eye Contact:  poor  Speech:  Slow  Volume:  Decreased  Mood:  Dysphoric  Affect:  Blunt  Thought Process:  Irrelevant  Orientation:  Full (Time, Place, and Person)  Thought Content:  Tangential  Suicidal Thoughts:  No  Homicidal Thoughts:  No  Memory:  Immediate;   Poor  Judgement:  Impaired  Insight:  Shallow  Psychomotor Activity:  Decreased  Concentration:  Concentration: Fair  Recall:  Fair  Fund of Knowledge:  Fair  Language:  Fair  Akathisia:  Negative  Handed:  Right  AIMS (if indicated):     Assets:  Social Support  ADL's:  Intact  Cognition:  WNL  Sleep:  Number of Hours: 6     Treatment Plan Summary: Daily contact with patient to assess and evaluate symptoms and progress in treatment, Medication management and Plan Patient will continue to be here as long as he is noncompliant due to  recent dangerousness, mother is hopeful for some type of [place] program [place] and [place] that his mother was hopeful for  [facility] Health CenterFARAH,Aria Jarrard, MD 06/20/2018, 9:38 AM

## 2018-06-20 NOTE — Progress Notes (Signed)
Recreation Therapy Notes  Date: 2.6.20 Time: 1000 Location: 500 Hall Dayroom  Group Topic: Communication, Team Building, Problem Solving  Goal Area(s) Addresses:  Patient will effectively work with peer towards shared goal.  Patient will identify skill used to make activity successful.  Patient will identify how skills used during activity can be used to reach post d/c goals.   Intervention: STEM Activity   Activity: [employer]. In teams, patients were asked to build the tallest freestanding tower possible out of 15 pipe cleaners. Systematically resources were removed, for example patient ability to use both hands and patient ability to verbally communicate.    Education: Pharmacist, community, Building control surveyor.   Education Outcome: Acknowledges education/In group clarification offered/Needs additional education.   Clinical Observations/Feedback:  Pt did not attend group.     Caroll Rancher, LRT/CTRS      Caroll Rancher A 06/20/2018 11:46 AM

## 2018-06-20 NOTE — Progress Notes (Signed)
D: Pt denies SI/HI/AVH. Pt is irritable and isolative this evening. " I'm not taking anything, that crap made me sick"   A: Pt was offered support and encouragement.  Pt was encourage to attend groups. Q 15 minute checks were done for safety.   R: safety maintained on unit.  Problem: Activity: Goal: Sleeping patterns will improve Outcome: Progressing   Problem: Coping: Goal: Ability to demonstrate self-control will improve Outcome: Progressing

## 2018-06-21 LAB — INFLUENZA PANEL BY PCR (TYPE A & B)
Influenza A By PCR: POSITIVE — AB
Influenza B By PCR: NEGATIVE

## 2018-06-21 MED ORDER — ACETAMINOPHEN 325 MG PO TABS
650.0000 mg | ORAL_TABLET | Freq: Four times a day (QID) | ORAL | Status: DC | PRN
  Administered 2018-06-21: 650 mg via ORAL
  Filled 2018-06-21: qty 2

## 2018-06-21 MED ORDER — OSELTAMIVIR PHOSPHATE 75 MG PO CAPS
75.0000 mg | ORAL_CAPSULE | Freq: Two times a day (BID) | ORAL | Status: AC
  Administered 2018-06-21 – 2018-06-25 (×6): 75 mg via ORAL
  Filled 2018-06-21 (×10): qty 1

## 2018-06-21 NOTE — Plan of Care (Signed)
  Problem: Health Behavior/Discharge Planning: Goal: Compliance with prescribed medication regimen will improve Outcome: Not Progressing   Dar Note: Patient still presents with irritable affect and mood.  Remained withdrawn and isolates to his room.  Patient complained about generalized body ache with temperature of 101.2.  Tylenol 650 mg given and Tamiflu 75 mg given with a lot of encouragement.  Refused the rest of his prescribed medications.  Fluid intake encouraged.  Droplet precaution maintained.  Routine safety checks maintained every 15 minutes.  Patient is safe on the unit.
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
## 2018-06-21 NOTE — Progress Notes (Signed)
Patient has remained isolative to his room. Writer went to his room to informed him about his scheduled medication. He reported that he was not going to take any medicines until he is well. He requested a gingerale from the cafeteria. Writer offered water but he wanted his soda. Safety maintained on unit with 15 min checks.

## 2018-06-21 NOTE — Progress Notes (Signed)
Recreation Therapy Notes  Date: 2.7.20 Time: 1000 Location: 500 Hall Dayroom  Group Topic: Communication, Team Building, Problem Solving  Goal Area(s) Addresses:  Patient will effectively work with peer towards shared goal.  Patient will identify skill used to make activity successful.  Patient will identify how skills used during activity can be used to reach post d/c goals.   Intervention: STEM Activity   Activity: In team's, using 10 medium plastic cups, patients were asked to build the tallest free standing tower possible.    Education: Pharmacist, community, Building control surveyor.   Education Outcome: Acknowledges education/In group clarification offered/Needs additional education.   Clinical Observations/Feedback: Pt did not attend group.    Caroll Rancher, LRT/CTRS         Caroll Rancher A 06/21/2018 12:19 PM

## 2018-06-22 DIAGNOSIS — F319 Bipolar disorder, unspecified: Secondary | ICD-10-CM

## 2018-06-22 MED ORDER — DIVALPROEX SODIUM 125 MG PO CSDR
250.0000 mg | DELAYED_RELEASE_CAPSULE | Freq: Two times a day (BID) | ORAL | Status: DC
  Filled 2018-06-22 (×12): qty 2

## 2018-06-22 MED ORDER — MENTHOL 3 MG MT LOZG: 1.0000 | LOZENGE | OROMUCOSAL | Status: DC | PRN

## 2018-06-22 NOTE — [facility] Group Notes (Signed)
[facility] Group Notes: (Clinical Social Work)   06/22/2018      Type of Therapy:  Group Therapy   Participation Level:  Did Not Attend despite MHT prompting   Adlee Paar Grossman-Orr, LCSW 06/22/2018, 12:58 PM     

## 2018-06-22 NOTE — Progress Notes (Signed)
Writer spoke with patient after he got off the phone. He reports that he has felt better today and actually ate food today. He did request throat lozenges for his cough and sore throat. Writer asked what he plans to do once discharged. He reports that he needs to work on getting him an apartment. Patient received a pitcher of gatorade and throat lozenger before returning to his room to rest. Snacks offered but patient declined. Safety maintained on unit with 15 min checks.
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
## 2018-06-23 NOTE — Progress Notes (Signed)
Report given by O. Ouida Sills RN that patient did not attend group but has been out of his room a little more.  He continues to refuse medications reporting that he does not need them.Patients temperature is normal. Safety maintained on unit with 15 min checks.
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
## 2018-06-23 NOTE — [facility] Group Notes (Signed)
[facility] LCSW Group Therapy Note  Date/Time:  06/23/2018  11:00AM-12:00PM  Type of Therapy and Topic:  Group Therapy:  Music and Mood  Participation Level:  Did Not Attend   Description of Group: In this process group, members listened to a variety of genres of music and identified that different types of music evoke different responses.  Patients were encouraged to identify music that was soothing for them and music that was energizing for them.  Patients discussed how this knowledge can help with wellness and recovery in various ways including managing depression and anxiety as well as encouraging healthy sleep habits.    Therapeutic Goals: 1. Patients will explore the impact of different varieties of music on mood 2. Patients will verbalize the thoughts they have when listening to different types of music 3. Patients will identify music that is soothing to them as well as music that is energizing to them 4. Patients will discuss how to use this knowledge to assist in maintaining wellness and recovery 5. Patients will explore the use of music as a coping skill  Summary of Patient Progress:  N/A  Therapeutic Modalities: Solution Focused Brief Therapy Activity   Ambrose Mantle, LCSW

## 2018-06-23 NOTE — Plan of Care (Signed)
D: Patient presents calm and cooperative. He feels he is ready for discharge, but is noncompliant with medications. He reports sleeping well last night. Patient denies SI/HI/AVH.  A: Patient checked q15 min, and checks reviewed. Reviewed medication changes with patient and educated on side effects. Educated patient on importance of attending group therapy sessions and educated on several coping skills. Encouarged participation in milieu through recreation therapy and attending meals with peers. Support and encouragement provided. Fluids offered. R: Patient contracts for safety on the unit. He set no goal for today.  Problem: Education: Goal: Emotional status will improve Outcome: Not Progressing Goal: Mental status will improve Outcome: Not Progressing   Problem: Activity: Goal: Interest or engagement in activities will improve Outcome: Not Progressing Goal: Sleeping patterns will improve Outcome: Not Progressing

## 2018-06-24 NOTE — Progress Notes (Signed)
Recreation Therapy Notes  Date: 2.10.20 Time:  1000 Location: 500 Hall Dayroom    Group Topic: Coping Skills  Goal Area(s) Addresses:  Patient will identify positive coping skills. Patient will identify benefits of using coping skills.  Intervention: Worksheet, pencils, white board, marker, eraser  Activity: Mind map.  LRT and patients filled in the first eight boxes with anxiety, anger, depression, finances, disrespect, death, loss of employment and relationships.  Patients were then given time to come up with at least 3 coping skills for each trigger identified.  Group would reconvene and LRT wrote the coping skills on the board.  Education: Pharmacologist, Building control surveyor.   Education Outcome: Acknowledges understanding/In group clarification offered/Needs additional education.   Clinical Observations/Feedback: Pt did not attend group.    Caroll Rancher, LRT/CTRS        Caroll Rancher A 06/24/2018 11:36 AM

## 2018-06-24 NOTE — Tx Team (Signed)
Interdisciplinary Treatment and Diagnostic Plan Update  06/24/2018 Time of Session: 0850 AM  Zachary LocketJoshua D Eaton MRN: [id number]  Principal Diagnosis: Bipolar I disorder (HCC)  Secondary Diagnoses: Principal Problem:   Bipolar I disorder (HCC) Active Problems:   MDD (major depressive disorder), severe (HCC)   PTSD (post-traumatic stress disorder)   Current Medications:  Current Facility-Administered Medications  Medication Dose Route Frequency Provider Last Rate Last Dose  . acetaminophen (TYLENOL) tablet 650 mg  650 mg Oral Q6H PRN Malvin JohnsFarah, Brian, MD   650 mg at 06/21/18 0951  . divalproex (DEPAKOTE SPRINKLE) capsule 250 mg  250 mg Oral Q12H Aldean BakerSykes, Janet E, NP      . lithium carbonate capsule 150 mg  150 mg Oral QHS Malvin JohnsFarah, Brian, MD      . ziprasidone (GEODON) injection 20 mg  20 mg Intramuscular Q12H PRN Antonieta Pertlary, Greg Lawson, MD       And  . LORazepam (ATIVAN) tablet 1 mg  1 mg Oral PRN Antonieta Pertlary, Greg Lawson, MD      . menthol-cetylpyridinium (CEPACOL) lozenge 3 mg  1 lozenge Oral PRN Nira ConnBerry, Jason A, NP      . nicotine polacrilex (NICORETTE) gum 2 mg  2 mg Oral PRN Nira ConnBerry, Jason A, NP      . omega-3 acid ethyl esters (LOVAZA) capsule 1 g  1 g Oral BID Malvin JohnsFarah, Brian, MD   1 g at 06/23/18 1710  . oseltamivir (TAMIFLU) capsule 75 mg  75 mg Oral BID Malvin JohnsFarah, Brian, MD   75 mg at 06/23/18 1710  . prenatal multivitamin tablet 1 tablet  1 tablet Oral Q1200 Malvin JohnsFarah, Brian, MD   1 tablet at 06/19/18 1146  . temazepam (RESTORIL) capsule 15 mg  15 mg Oral QHS Nira ConnBerry, Jason A, NP   15 mg at 06/17/18 2140   PTA Medications: No medications prior to admission.    Patient Stressors: Paediatric nurseinancial difficulties Legal issue Marital or family conflict  Patient Strengths: Ability for insight Average or above average intelligence Capable of independent living General fund of knowledge Supportive family/friends  Treatment Modalities: Medication Management, Group therapy, Case management,  1 to 1 session with  clinician, Psychoeducation, Recreational therapy.   Physician Treatment Plan for Primary Diagnosis: Bipolar I disorder (HCC) Long Term Goal(s): Improvement in symptoms so as ready for discharge Improvement in symptoms so as ready for discharge   Short Term Goals: Ability to maintain clinical measurements within normal limits will improve Compliance with prescribed medications will improve Ability to identify triggers associated with substance abuse/mental health issues will improve Ability to maintain clinical measurements within normal limits will improve Compliance with prescribed medications will improve Ability to identify triggers associated with substance abuse/mental health issues will improve  Medication Management: Evaluate patient's response, side effects, and tolerance of medication regimen.  Therapeutic Interventions: 1 to 1 sessions, Unit Group sessions and Medication administration.  Evaluation of Outcomes: Not Progressing  Physician Treatment Plan for Secondary Diagnosis: Principal Problem:   Bipolar I disorder (HCC) Active Problems:   MDD (major depressive disorder), severe (HCC)   PTSD (post-traumatic stress disorder)  Long Term Goal(s): Improvement in symptoms so as ready for discharge Improvement in symptoms so as ready for discharge   Short Term Goals: Ability to maintain clinical measurements within normal limits will improve Compliance with prescribed medications will improve Ability to identify triggers associated with substance abuse/mental health issues will improve Ability to maintain clinical measurements within normal limits will improve Compliance with prescribed medications will improve Ability to  identify triggers associated with substance abuse/mental health issues will improve     Medication Management: Evaluate patient's response, side effects, and tolerance of medication regimen.  Therapeutic Interventions: 1 to 1 sessions, Unit Group sessions  and Medication administration.  Evaluation of Outcomes: Not Progressing   RN Treatment Plan for Primary Diagnosis: Bipolar I disorder (HCC) Long Term Goal(s): Knowledge of disease and therapeutic regimen to maintain health will improve  Short Term Goals: Ability to identify and develop effective coping behaviors will improve and Compliance with prescribed medications will improve  Medication Management: RN will administer medications as ordered by provider, will assess and evaluate patient's response and provide education to patient for prescribed medication. RN will report any adverse and/or side effects to prescribing provider.  Therapeutic Interventions: 1 on 1 counseling sessions, Psychoeducation, Medication administration, Evaluate responses to treatment, Monitor vital signs and CBGs as ordered, Perform/monitor CIWA, COWS, AIMS and Fall Risk screenings as ordered, Perform wound care treatments as ordered.  Evaluation of Outcomes: Not Progressing   LCSW Treatment Plan for Primary Diagnosis: Bipolar I disorder (HCC) Long Term Goal(s): Safe transition to appropriate next level of care at discharge, Engage patient in therapeutic group addressing interpersonal concerns.  Short Term Goals: Engage patient in aftercare planning with referrals and resources, Increase social support and Increase skills for wellness and recovery  Therapeutic Interventions: Assess for all discharge needs, 1 to 1 time with Social worker, Explore available resources and support systems, Assess for adequacy in community support network, Educate family and significant other(s) on suicide prevention, Complete Psychosocial Assessment, Interpersonal group therapy.  Evaluation of Outcomes: Progressing   Progress in Treatment: Attending groups: Yes. Participating in groups: Yes. Taking medication as prescribed: Yes. Toleration medication: Yes. Family/Significant other contact made: Yes, with parents.  Patient  understands diagnosis: No. Discussing patient identified problems/goals with staff: No. Medical problems stabilized or resolved: No. Denies suicidal/homicidal ideation: Yes. Issues/concerns per patient self-inventory: No. Other:   New problem(s) identified: No, Describe:  None reported  New Short Term/Long Term Goal(s):   Patient Goals:  "Get back to feeling better"  Discharge Plan or Barriers: Access to transportation  Reason for Continuation of Hospitalization: Aggression Depression Delusions Homicidal ideation Medical Issues, TBI Medication stabilization Other; describe PTSD severe  Estimated Length of Stay: 5-7 days  Attendees: Patient: 06/24/2018   Physician: Dr. Jeannine Kitten, MD 06/24/2018   Nursing: Sherryl Manges, RN 06/24/2018   RN Care Manager: 06/24/2018   Social Worker: Daleen Squibb, LCSW 06/24/2018   Recreational Therapist:  06/24/2018   Other:  06/24/2018   Other:  06/24/2018   Other: 06/24/2018       Scribe for Treatment Team: Wyn Quaker, MSW Intern 06/24/2018 10:28 AM
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
## 2018-06-24 NOTE — Progress Notes (Signed)
D: Pt denies SI/HI/AVH. Pt is pleasant and cooperative. Pt educated on the medications did not make him sick. Pt agreed to take his night medications , but refused his Depakote due to the taste made him sick and he threw up.   A: Pt was offered support and encouragement. Pt was given scheduled medications , but Depakote. Pt was encourage to attend groups. Q 15 minute checks were done for safety.   R:Pt attends groups and interacts well with peers and staff. Pt is taking medication. Pt has no complaints.Pt receptive to treatment and safety maintained on unit.  Problem: Education: Goal: Emotional status will improve Outcome: Progressing   Problem: Education: Goal: Mental status will improve Outcome: Progressing   Problem: Activity: Goal: Sleeping patterns will improve Outcome: Progressing

## 2018-06-24 NOTE — Progress Notes (Signed)
CSW faxed updated vitals to [facility].  Zachary Eaton out today, CSW directed to [facility] [id number].  CSW was able to speak with Zachary Eaton and she will review the updated vitals and get back to CSW. Garner Nash, MSW, LCSW Clinical Social Worker 06/24/2018 10:39 AM

## 2018-06-24 NOTE — Progress Notes (Signed)
[facility]  06/24/2018 10:26 AM Zachary Eaton  MRN:  [id number] Subjective:    Patient reports better compliance is extremely focused on discharge however states he really does not have a specific place to stay but will leave his home and look for a place clearly not a plan that is formed.  He still resists [facility] so forth continues to not accept his diagnosis of bipolar type condition in the context of traumatic brain injury.  Again cooperative with me generally redirectable but very focused on discharge and lacking insight.  Principal Problem: Bipolar I disorder (HCC) Diagnosis: Principal Problem:   Bipolar I disorder (HCC) Active Problems:   MDD (major depressive disorder), severe (HCC)   PTSD (post-traumatic stress disorder)  Total Time spent with patient: 30 minutes  Past Medical History:  Past Medical History:  Diagnosis Date  . Migraines   . Traumatic brain injury [facility](HCC)     Past Surgical History:  Procedure Laterality Date  . ANKLE SURGERY Left    Family History: History reviewed. No pertinent family history. Social History:  Social History   Substance and Sexual Activity  Alcohol Use Yes   Comment: occ     Social History   Substance and Sexual Activity  Drug Use Not Currently    Social History   Socioeconomic History  . Marital status: Single    Spouse name: Not on file  . Number of children: Not on file  . Years of education: Not on file  . Highest education level: Not on file  Occupational History  . Not on file  Social Needs  . Financial resource strain: Not on file  . Food insecurity:    Worry: Not on file    Inability: Not on file  . Transportation needs:    Medical: Not on file    Non-medical: Not on file  Tobacco Use  . Smoking status: Current Some Day Smoker    Packs/day: 0.25    Types: Cigarettes  . Smokeless tobacco: Current User    Types: Chew  Substance and Sexual Activity  . Alcohol  use: Yes    Comment: occ  . Drug use: Not Currently  . Sexual activity: Not Currently  Lifestyle  . Physical activity:    Days per week: Not on file    Minutes per session: Not on file  . Stress: Not on file  Relationships  . Social connections:    Talks on phone: Not on file    Gets together: Not on file    Attends religious service: Not on file    Active member of club or organization: Not on file    Attends meetings of clubs or organizations: Not on file    Relationship status: Not on file  Other Topics Concern  . Not on file  Social History Narrative  . Not on file   Additional Social History:                         Sleep: Fair  Appetite:  Fair  Current Medications: Current Facility-Administered Medications  Medication Dose Route Frequency Provider Last Rate Last Dose  . acetaminophen (TYLENOL) tablet 650 mg  650 mg Oral Q6H PRN Malvin JohnsFarah, Keyleen Cerrato, MD   650 mg at 06/21/18 0951  . divalproex (DEPAKOTE SPRINKLE) capsule 250 mg  250 mg Oral Q12H Marciano SequinSykes, Janet E, NP      . lithium carbonate capsule 150 mg  150 mg Oral QHS Malvin JohnsFarah, Isobelle Tuckett, MD      . ziprasidone (GEODON) injection 20 mg  20 mg Intramuscular Q12H PRN Antonieta Pertlary, Greg Lawson, MD       And  . LORazepam (ATIVAN) tablet 1 mg  1 mg Oral PRN Antonieta Pertlary, Greg Lawson, MD      . menthol-cetylpyridinium (CEPACOL) lozenge 3 mg  1 lozenge Oral PRN Nira ConnBerry, Jason A, NP      . nicotine polacrilex (NICORETTE) gum 2 mg  2 mg Oral PRN Nira ConnBerry, Jason A, NP      . omega-3 acid ethyl esters (LOVAZA) capsule 1 g  1 g Oral BID Malvin JohnsFarah, Leotis Isham, MD   1 g at 06/23/18 1710  . oseltamivir (TAMIFLU) capsule 75 mg  75 mg Oral BID Malvin JohnsFarah, Kilah Drahos, MD   75 mg at 06/23/18 1710  . prenatal multivitamin tablet 1 tablet  1 tablet Oral Q1200 Malvin JohnsFarah, Brenlee Koskela, MD   1 tablet at 06/19/18 1146  . temazepam (RESTORIL) capsule 15 mg  15 mg Oral QHS Nira ConnBerry, Jason A, NP   15 mg at 06/17/18 2140    Lab Results: No results found for this or any previous visit (from the past  48 hour(s)).  Blood Alcohol level:  Lab Results  Component Value Date   ETH <10 06/11/2018    Metabolic Disorder Labs: No results found for: HGBA1C, MPG No results found for: PROLACTIN No results found for: CHOL, TRIG, HDL, CHOLHDL, VLDL, LDLCALC  Physical Findings: AIMS: Facial and Oral Movements Muscles of Facial Expression: None, normal Lips and Perioral Area: None, normal Jaw: None, normal Tongue: None, normal,Extremity Movements Upper (arms, wrists, hands, fingers): None, normal Lower (legs, knees, ankles, toes): None, normal, Trunk Movements Neck, shoulders, hips: None, normal, Overall Severity Severity of abnormal movements (highest score from questions above): None, normal Incapacitation due to abnormal movements: None, normal Patient's awareness of abnormal movements (rate only patient's report): No Awareness, Dental Status Current problems with teeth and/or dentures?: No Does patient usually wear dentures?: No  CIWA:  CIWA-Ar Total: 0 COWS:  COWS Total Score: 0  Musculoskeletal: Strength & Muscle Tone: within normal limits Gait & Station: normal Patient leans: N/A  Psychiatric Specialty Exam: Physical Exam  ROS  Blood pressure 107/64, pulse 62, temperature 98.5 F (36.9 C), temperature source Oral, resp. rate 18, height 5\' 9"  (1.753 m), weight 74.8 kg, SpO2 96 %.Body mass index is 24.37 kg/m.  General Appearance: Casual  Eye Contact:  Good  Speech:  Clear and Coherent  Volume:  Decreased  Mood:  Anxious and Depressed  Affect:  Blunt  Thought Process:  Irrelevant  Orientation:  Full (Time, Place, and Person)  Thought Content:  Tangential  Suicidal Thoughts: Denies  Homicidal Thoughts:  No  Memory:  Immediate;   Fair  Judgement:  Impaired  Insight:  Shallow  Psychomotor Activity:  Normal  Concentration:  Concentration: Fair  Recall:  Fair  Fund of Knowledge:  Good  Language:  Good  Akathisia:  Negative  Handed:  Right  AIMS (if indicated):      Assets:  Resilience Social Support  ADL's:  Intact  Cognition:  WNL  Sleep:  Number of Hours: 5.25     Treatment Plan Summary: Daily contact with patient to assess and evaluate symptoms and progress in treatment, Medication management and Plan Continue reality based therapies therapy for affective instability cognitive-based therapy, continue to monitor for safety and seek meaningful follow-up plans.  Court date tomorrow  Malvin JohnsFARAH,Anagabriela Jokerst, MD 06/24/2018, 10:26 AM

## 2018-06-24 NOTE — [facility] Group Notes (Signed)
[facility] LCSW Group Therapy Note  Date/Time: 06/24/18, 1315  Type of Therapy and Topic:  Group Therapy:  Overcoming Obstacles  Participation Level:  Did not attend  Description of Group:    In this group patients will be encouraged to explore what they see as obstacles to their own wellness and recovery. They will be guided to discuss their thoughts, feelings, and behaviors related to these obstacles. The group will process together ways to cope with barriers, with attention given to specific choices patients can make. Each patient will be challenged to identify changes they are motivated to make in order to overcome their obstacles. This group will be process-oriented, with patients participating in exploration of their own experiences as well as giving and receiving support and challenge from other group members.  Therapeutic Goals: 1. Patient will identify personal and current obstacles as they relate to admission. 2. Patient will identify barriers that currently interfere with their wellness or overcoming obstacles.  3. Patient will identify feelings, thought process and behaviors related to these barriers. 4. Patient will identify two changes they are willing to make to overcome these obstacles:    Summary of Patient Progress      Therapeutic Modalities:   Cognitive Behavioral Therapy Solution Focused Therapy Motivational Interviewing Relapse Prevention Therapy  Greg Murrel Bertram, LCSW 

## 2018-06-24 NOTE — Progress Notes (Signed)
DAR Note: Pt observed in his room on initial contact. Denies SI, HI, AVH and pain. Per pt "I'm fine, I just want to leave here, I need to go home, I'm not going to take no medicine and fall asleep when the guy next door keeps coming into my room". Demanding to be d/c home today despite multiple explanation about long term care placement. Continues to refused all medications when offered; assigned provider notified. Pt proceeded to calling nursing secretary phone multiple times this shift, "I need to leave, I need you to call the police to come get me because you guys made me sick with the Flu too". Pt tolerates meals well. Scheduled medications given per provider's order. Emotional support, reassurance and encouragement offered to pt throughout this shift. Q 15 minutes safety checks maintained without incident to note thus far.  Pt remains safe on unit. POC continues for safety and mood stability.

## 2018-06-25 NOTE — Progress Notes (Signed)
DAR Note: Pt observed in room awake in bed on initial approach. Refused to take his medications initially "I don't want no medications, I have a court date today, my folks are trying to get me out and you want to hold me here, tell your doctor I'm not taking no shot, I only took shot to get into the [employer]". "This is freaking ridiculous, I need to get out of here, I've been here for freaking 10 days". Pt has been selectively compliant with his medications. Refused his Depakote "I don't want that, that's what made me sick". Pt proceeded to calling 911 "to come get me out of here". All medications administered per provider's order with verbal education and effects monitored. Encouraged pt to voice concerns, attend to ADLs and comply with current treatment regimen. Q 15 minutes safety checks maintained without self harm gestures or outburst. Pt continues to demand discharge. Continues to need frequent redirections related to phone use, discharge procedure without success. Safety maintained on unit.

## 2018-06-25 NOTE — Progress Notes (Signed)
[facility] MD Progress Note  06/25/2018 11:41 AM Zachary Eaton  MRN:  [id number] Subjective:    Patient continues to deny all symptoms on long before discharge.  Understands he has a court hearing today regarding the petition for involuntary commitment States he will not take medications will not take a shot states he simply does not need them gust the safety concerns and in particular concerns of his parents, states he will move away from his parents therefore they are concerned would not matter to him Principal Problem: Bipolar I disorder (HCC) Diagnosis: Principal Problem:   Bipolar I disorder (HCC) Active Problems:   MDD (major depressive disorder), severe (HCC)   PTSD (post-traumatic stress disorder)  Total Time spent with patient: 20 minutes  Past Medical History:  Past Medical History:  Diagnosis Date  . Migraines   . Traumatic brain injury [facility])     Past Surgical History:  Procedure Laterality Date  . ANKLE SURGERY Left    Family History: History reviewed. No pertinent family history. Family Psychiatric  History: Mother reports that patient's father had required over a month of stabilization in the hospital and that is why patient feels he is being held Social History:  Social History   Substance and Sexual Activity  Alcohol Use Yes   Comment: occ     Social History   Substance and Sexual Activity  Drug Use Not Currently    Social History   Socioeconomic History  . Marital status: Single    Spouse name: Not on file  . Number of children: Not on file  . Years of education: Not on file  . Highest education level: Not on file  Occupational History  . Not on file  Social Needs  . Financial resource strain: Not on file  . Food insecurity:    Worry: Not on file    Inability: Not on file  . Transportation needs:    Medical: Not on file    Non-medical: Not on file  Tobacco Use  . Smoking status: Current Some Day Smoker    Packs/day: 0.25    Types:  Cigarettes  . Smokeless tobacco: Current User    Types: Chew  Substance and Sexual Activity  . Alcohol use: Yes    Comment: occ  . Drug use: Not Currently  . Sexual activity: Not Currently  Lifestyle  . Physical activity:    Days per week: Not on file    Minutes per session: Not on file  . Stress: Not on file  Relationships  . Social connections:    Talks on phone: Not on file    Gets together: Not on file    Attends religious service: Not on file    Active member of club or organization: Not on file    Attends meetings of clubs or organizations: Not on file    Relationship status: Not on file  Other Topics Concern  . Not on file  Social History Narrative  . Not on file   Additional Social History:                         Sleep: Fair  Appetite:  Fair  Current Medications: Current Facility-Administered Medications  Medication Dose Route Frequency Provider Last Rate Last Dose  . acetaminophen (TYLENOL) tablet 650 mg  650 mg Oral Q6H PRN Malvin Johns, MD   650 mg at 06/21/18 0951  . divalproex (DEPAKOTE SPRINKLE) capsule 250 mg  250 mg Oral  Q12H Aldean BakerSykes, Janet E, NP      . lithium carbonate capsule 150 mg  150 mg Oral QHS Malvin JohnsFarah, Elaya Droege, MD   150 mg at 06/24/18 2112  . ziprasidone (GEODON) injection 20 mg  20 mg Intramuscular Q12H PRN Antonieta Pertlary, Greg Lawson, MD       And  . LORazepam (ATIVAN) tablet 1 mg  1 mg Oral PRN Antonieta Pertlary, Greg Lawson, MD      . menthol-cetylpyridinium (CEPACOL) lozenge 3 mg  1 lozenge Oral PRN Nira ConnBerry, Jason A, NP      . nicotine polacrilex (NICORETTE) gum 2 mg  2 mg Oral PRN Nira ConnBerry, Jason A, NP      . omega-3 acid ethyl esters (LOVAZA) capsule 1 g  1 g Oral BID Malvin JohnsFarah, Aking Klabunde, MD   1 g at 06/23/18 1710  . oseltamivir (TAMIFLU) capsule 75 mg  75 mg Oral BID Malvin JohnsFarah, Tessla Spurling, MD   75 mg at 06/23/18 1710  . prenatal multivitamin tablet 1 tablet  1 tablet Oral Q1200 Malvin JohnsFarah, Rolla Kedzierski, MD   1 tablet at 06/19/18 1146  . temazepam (RESTORIL) capsule 15 mg  15 mg Oral  QHS Nira ConnBerry, Jason A, NP   15 mg at 06/24/18 2112    Lab Results: No results found for this or any previous visit (from the past 48 hour(s)).  Blood Alcohol level:  Lab Results  Component Value Date   ETH <10 06/11/2018    Metabolic Disorder Labs: No results found for: HGBA1C, MPG No results found for: PROLACTIN No results found for: CHOL, TRIG, HDL, CHOLHDL, VLDL, LDLCALC  Physical Findings: AIMS: Facial and Oral Movements Muscles of Facial Expression: None, normal Lips and Perioral Area: None, normal Jaw: None, normal Tongue: None, normal,Extremity Movements Upper (arms, wrists, hands, fingers): None, normal Lower (legs, knees, ankles, toes): None, normal, Trunk Movements Neck, shoulders, hips: None, normal, Overall Severity Severity of abnormal movements (highest score from questions above): None, normal Incapacitation due to abnormal movements: None, normal Patient's awareness of abnormal movements (rate only patient's report): No Awareness, Dental Status Current problems with teeth and/or dentures?: No Does patient usually wear dentures?: No  CIWA:  CIWA-Ar Total: 0 COWS:  COWS Total Score: 0  Musculoskeletal: Strength & Muscle Tone: within normal limits Gait & Station: normal Patient leans: N/A  Psychiatric Specialty Exam: Physical Exam  ROS  Blood pressure 107/64, pulse 62, temperature 98.5 F (36.9 C), temperature source Oral, resp. rate 18, height 5\' 9"  (1.753 m), weight 74.8 kg, SpO2 96 %.Body mass index is 24.37 kg/m.  General Appearance: Casual  Eye Contact:  Fair  Speech:  Clear and Coherent  Volume:  Normal  Mood:  Irritable  Affect:  Congruent  Thought Process:  Irrelevant  Orientation:  Full (Time, Place, and Person)  Thought Content:  Tangential  Suicidal Thoughts:  No  Homicidal Thoughts:  No  Memory:  Immediate;   Fair  Judgement:  poor  Insight:  lacks  Psychomotor Activity:  Normal  Concentration:  Concentration: Fair  Recall:  [employer]   Fund of Knowledge:  Fair  Language:  Fair  Akathisia:  NA  Handed:  Right  AIMS (if indicated):     Assets:  Resilience Social Support  ADL's:  Intact  Cognition:  WNL  Sleep:  Number of Hours: 3.75     Treatment Plan Summary: Daily contact with patient to assess and evaluate symptoms and progress in treatment, Medication management and Continue current precautions again will go to court today regarding commitment continue  to encourage compliance discussed long-acting injectable patient refuses for now discussed how this may be his only take it for to be discharged except on long-acting injectable states he fears the medication will go in his neck and stay for an entire year explained to him that is not accurate  Malvin Johns, MD 06/25/2018, 11:41 AM

## 2018-06-25 NOTE — Progress Notes (Signed)
Recreation Therapy Notes  Date: 2.11.20 Time: 1000 Location: [address] Dayroom  Group Topic:  Goal Setting  Goal Area(s) Addresses:  Patient will be able to identify at least 3 life goals.  Patient will be able to identify benefit of investing in life goals.  Patient will be able to identify benefit of setting life goals.   Intervention: Worksheet, pencils  Activity: Goal Planning.  Patients were to identify goals they want to accomplish in a week, a month, in 1 yr and in 5 yrs.  Patients were to then identify obstacles to achieving their goals, what they need to accomplish their goals and what they can start doing now to reach their goals.  Education:  Discharge Planning, Coping Skills, Life Goals   Education Outcome: Acknowledges Education/In Group Clarification Provided/Needs Additional Education  Clinical Observations: Pt did not attend group.      Caroll Rancher, LRT/CTRS         Caroll Rancher A 06/25/2018 11:39 AM

## 2018-06-25 NOTE — Progress Notes (Signed)
CSW received a voicemail from [place], [place] transfer coordinator regarding a possible bed opening.   CSW attempted to follow up and left a voicemail [phone] EXT: [phone]).  CSW following for discharge needs.  Enid Cutter, LCSW-A Clinical Social Worker

## 2018-06-25 NOTE — Progress Notes (Signed)
D: Pt denies SI/HI/AVH. Pt is pleasant and cooperative , but was irritable and upset earlier. Pt stated he wanted to leave. Pt did not understand why he had to take medication, and did not want to take the injection because he did not like the way the medication makes him feel and he would be feeling that way for 30 days. 1:1 time spent with pt educating about the medications, pt initially refused , but later agreed to take the Lithium and Restoril [facility].  A: Pt was offered support and encouragement. Pt was given scheduled medications except Depakote . Pt was encourage to attend groups. Q 15 minute checks were done for safety.   R:Pt attends groups and interacts well with peers and staff. Pt is taking medication. Pt receptive to treatment and safety maintained on unit.

## 2018-06-26 MED ORDER — OMEGA-3-ACID ETHYL ESTERS 1 G PO CAPS: 1.0000 g | ORAL_CAPSULE | Freq: Two times a day (BID) | ORAL | 1 refills | Status: DC

## 2018-06-26 MED ORDER — LITHIUM CARBONATE 150 MG PO CAPS: 150.0000 mg | ORAL_CAPSULE | Freq: Every day | ORAL | 1 refills | Status: DC

## 2018-06-26 MED ORDER — DIVALPROEX SODIUM 125 MG PO CSDR: 250.0000 mg | DELAYED_RELEASE_CAPSULE | Freq: Two times a day (BID) | ORAL | 1 refills | Status: DC

## 2018-06-26 NOTE — Progress Notes (Signed)
[facility] MD Progress Note  06/26/2018 10:40 AM Zachary Eaton  MRN:  [id number] Subjective:   Patient is generally more cooperative and compliant he states he took his meds this morning he still does not see the need for them he is giving me a list of for potential discharge plans that include things like getting an apartment going to open his own rehab type facility so forth some of these are grandiose and not achievable but bottom line is the [place] is looking at him for more longer term care  Principal Problem: Bipolar I disorder (HCC) Diagnosis: Principal Problem:   Bipolar I disorder (HCC) Active Problems:   MDD (major depressive disorder), severe (HCC)   PTSD (post-traumatic stress disorder)  Total Time spent with patient: 20 minutes  Past Medical History:  Past Medical History:  Diagnosis Date  . Migraines   . Traumatic brain injury [facility])     Past Surgical History:  Procedure Laterality Date  . ANKLE SURGERY Left    Family History: History reviewed. No pertinent family history.  Social History:  Social History   Substance and Sexual Activity  Alcohol Use Yes   Comment: occ     Social History   Substance and Sexual Activity  Drug Use Not Currently    Social History   Socioeconomic History  . Marital status: Single    Spouse name: Not on file  . Number of children: Not on file  . Years of education: Not on file  . Highest education level: Not on file  Occupational History  . Not on file  Social Needs  . Financial resource strain: Not on file  . Food insecurity:    Worry: Not on file    Inability: Not on file  . Transportation needs:    Medical: Not on file    Non-medical: Not on file  Tobacco Use  . Smoking status: Current Some Day Smoker    Packs/day: 0.25    Types: Cigarettes  . Smokeless tobacco: Current User    Types: Chew  Substance and Sexual Activity  . Alcohol use: Yes    Comment: occ  . Drug use: Not Currently  . Sexual activity: Not  Currently  Lifestyle  . Physical activity:    Days per week: Not on file    Minutes per session: Not on file  . Stress: Not on file  Relationships  . Social connections:    Talks on phone: Not on file    Gets together: Not on file    Attends religious service: Not on file    Active member of club or organization: Not on file    Attends meetings of clubs or organizations: Not on file    Relationship status: Not on file  Other Topics Concern  . Not on file  Social History Narrative  . Not on file   Additional Social History:                         Sleep: Fair  Appetite:  Fair  Current Medications: Current Facility-Administered Medications  Medication Dose Route Frequency Provider Last Rate Last Dose  . acetaminophen (TYLENOL) tablet 650 mg  650 mg Oral Q6H PRN Malvin Johns, MD   650 mg at 06/21/18 0951  . divalproex (DEPAKOTE SPRINKLE) capsule 250 mg  250 mg Oral Q12H Aldean Baker, NP      . lithium carbonate capsule 150 mg  150 mg Oral QHS Malvin Johns,  MD   150 mg at 06/25/18 2113  . ziprasidone (GEODON) injection 20 mg  20 mg Intramuscular Q12H PRN Antonieta Pert, MD       And  . LORazepam (ATIVAN) tablet 1 mg  1 mg Oral PRN Antonieta Pert, MD      . menthol-cetylpyridinium (CEPACOL) lozenge 3 mg  1 lozenge Oral PRN Nira Conn A, NP      . nicotine polacrilex (NICORETTE) gum 2 mg  2 mg Oral PRN Nira Conn A, NP   2 mg at 06/25/18 2115  . omega-3 acid ethyl esters (LOVAZA) capsule 1 g  1 g Oral BID Malvin Johns, MD   1 g at 06/25/18 0900  . prenatal multivitamin tablet 1 tablet  1 tablet Oral Q1200 Malvin Johns, MD   1 tablet at 06/25/18 1218  . temazepam (RESTORIL) capsule 15 mg  15 mg Oral QHS Nira Conn A, NP   15 mg at 06/25/18 2112    Lab Results: No results found for this or any previous visit (from the past 48 hour(s)).  Blood Alcohol level:  Lab Results  Component Value Date   ETH <10 06/11/2018    Metabolic Disorder Labs: No  results found for: HGBA1C, MPG No results found for: PROLACTIN No results found for: CHOL, TRIG, HDL, CHOLHDL, VLDL, LDLCALC  Physical Findings: AIMS: Facial and Oral Movements Muscles of Facial Expression: None, normal Lips and Perioral Area: None, normal Jaw: None, normal Tongue: None, normal,Extremity Movements Upper (arms, wrists, hands, fingers): None, normal Lower (legs, knees, ankles, toes): None, normal, Trunk Movements Neck, shoulders, hips: None, normal, Overall Severity Severity of abnormal movements (highest score from questions above): None, normal Incapacitation due to abnormal movements: None, normal Patient's awareness of abnormal movements (rate only patient's report): No Awareness, Dental Status Current problems with teeth and/or dentures?: No Does patient usually wear dentures?: No  CIWA:  CIWA-Ar Total: 0 COWS:  COWS Total Score: 0  Musculoskeletal: Strength & Muscle Tone: within normal limits Gait & Station: normal Patient leans: N/A  Psychiatric Specialty Exam: Physical Exam  ROS  Blood pressure 107/64, pulse 62, temperature 98.5 F (36.9 C), temperature source Oral, resp. rate 18, height 5\' 9"  (1.753 m), weight 74.8 kg, SpO2 96 %.Body mass index is 24.37 kg/m.  General Appearance: Casual  Eye Contact:  Fair  Speech:  Clear and Coherent  Volume:  Normal  Mood:  Dysphoric  Affect:  Congruent  Thought Process:  Linear  Orientation:  Full (Time, Place, and Person)  Thought Content:  Tangential  Suicidal Thoughts:  No  Homicidal Thoughts:  No  Memory:  Immediate;   Fair  Judgement:  poor  Insight:  Fair and Shallow  Psychomotor Activity:  Normal  Concentration:  Concentration: Fair  Recall:  Good  Fund of Knowledge:  Good  Language:  Negative for issues  Akathisia:  Negative  Handed:  Right  AIMS (if indicated):     Assets:  Resilience Social Support  ADL's:  Intact  Cognition:  WNL  Sleep:  Number of Hours: 4.5     Treatment Plan  Summary: Daily contact with patient to assess and evaluate symptoms and progress in treatment, Medication management and Plan Continue current measures for bipolar type disorder as a consequence of TBI continue to see if be able except for longer-term treatment  Liliauna Santoni, MD 06/26/2018, 10:40 AM

## 2018-06-26 NOTE — Progress Notes (Signed)
Recreation Therapy Notes  Date: 2.12.20 Time: 1000 Location: 500 Hall Dayroom  Group Topic: Self-Esteem  Goal Area(s) Addresses:  Patient will successfully identify positive attributes about themselves.  Patient will successfully identify benefit of improved self-esteem.   Intervention: Worksheet, Nutritional therapist, markers, peel and stick designs  Activity: Crest of Arms.  Patients were given a crest that was divided into four sections.  In each section, patients were to identify something that makes them unique, something they are proud of, important dates, things they have accomplished or just whatever they are proud of about themselves.  Education:  Self-Esteem, Building control surveyor.   Education Outcome: Acknowledges education/In group clarification offered/Needs additional education  Clinical Observations/Feedback: Pt did not attend group.     Caroll Rancher, LRT/CTRS      Caroll Rancher A 06/26/2018 11:44 AM

## 2018-06-26 NOTE — BHH Suicide Risk Assessment (Signed)
[facility] Discharge Suicide Risk Assessment   Principal Problem: Bipolar I disorder [facility]) Discharge Diagnoses: Principal Problem:   Bipolar I disorder (HCC) Active Problems:   MDD (major depressive disorder), severe (HCC)   PTSD (post-traumatic stress disorder)   Total Time spent with patient: 45 minutes   Mental Status Per Nursing Assessment::   On Admission:  NA  Demographic Factors:  Male and Caucasian  Loss Factors: Decrease in vocational status  Historical Factors: Impulsivity  Risk Reduction Factors:   Religious beliefs about death  Continued Clinical Symptoms:  Bipolar Disorder:   Depressive phase  Cognitive Features That Contribute To Risk:  Loss of executive function    Suicide Risk:  Minimal: No identifiable suicidal ideation.  Patients presenting with no risk factors but with morbid ruminations; may be classified as minimal risk based on the severity of the depressive symptoms  Follow-up Information    Clinic, [facility] Go in 3 day(s).   Why:  Please attend a walk in appt at your primary care MD clinic ([facility]) Monday-Friday between 9am-2pm.   Contact information: [address] [facility] [place] [address] [phone]        [facility]. Go on 06/26/2018.   Specialty:  Behavioral Health Why:  You will be transported to [facility] inpatient unit for further treatment.  Contact information: [address] [place] [address] [phone]          Plan Of Care/Follow-up recommendations:  Activity:  full  Anaclara Acklin, MD 06/26/2018, 11:36 AM
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
## 2018-06-26 NOTE — Progress Notes (Signed)
  [facility] Adult Case Management Discharge Plan :  Will you be returning to the same living situation after discharge:  No. Transfer to [facility].  At discharge, do you have transportation home?: Yes,  [employer] Do you have the ability to pay for your medications: No. Will work with [place].  Release of information consent forms completed and in the chart;  Patient's signature needed at discharge.  Patient to Follow up at: Follow-up Information    Clinic, [facility] Go in 3 day(s).   Why:  Please attend a walk in appt at your primary care MD clinic ([facility]) Monday-Friday between 9am-2pm.   Contact information: [address] [facility] [place] [address] [phone]        [facility]. Go on 06/26/2018.   Specialty:  Behavioral Health Why:  You will be transported to [facility] inpatient unit for further treatment.  Contact information: [address] [place] [address] [phone]          Next level of care provider has access to [facility] Link:no  Safety Planning and Suicide Prevention discussed: Yes,  with mother  Have you used any form of tobacco in the last 30 days? (Cigarettes, Smokeless Tobacco, Cigars, and/or Pipes): Yes  Has patient been referred to the Quitline?: Patient refused referral  Patient has been referred for addiction treatment: N/A  Lorri Frederick, LCSW 06/26/2018, 2:40 PM

## 2018-06-26 NOTE — Progress Notes (Signed)
CSW received call from April, transfer coordinator at [facility].  Pt has been accepted to the program.  Direct admit.  Bldg 8, 1st floor. Rm 115-1.  Call report to [phone] or [phone].  Accepting MD: Santo Held  [facility] called for transport. Garner Nash, MSW, LCSW Clinical Social Worker 06/26/2018 11:32 AM

## 2018-06-26 NOTE — Plan of Care (Signed)
Pt attended one recreation therapy group session.    Aveena Bari, LRT/CTRS 

## 2018-06-26 NOTE — Progress Notes (Signed)
Recreation Therapy Notes  INPATIENT RECREATION TR PLAN  Patient Details Name: Zachary Eaton MRN: [id number] DOB: 04/06/1986 Today's Date: 06/26/2018  Rec Therapy Plan Is patient appropriate for Therapeutic Recreation?: Yes Treatment times per week: about 3 days Estimated Length of Stay: 5-7 days TR Treatment/Interventions: Group participation (Comment)  Discharge Criteria Pt will be discharged from therapy if:: Discharged Treatment plan/goals/alternatives discussed and agreed upon by:: Patient/family  Discharge Summary Short term goals set: See patient care plan Short term goals met: Not met Progress toward goals comments: Groups attended Which groups?: Wellness Reason goals not met: Pt attended on group session. Therapeutic equipment acquired: N/A Reason patient discharged from therapy: Discharge from hospital Pt/family agrees with progress & goals achieved: Yes Date patient discharged from therapy: 06/26/18     Victorino Sparrow, LRT/CTRS  Ria Comment, Fransisco Messmer A 06/26/2018, 12:29 PM

## 2018-06-26 NOTE — Progress Notes (Signed)
CSW received call from Eli Lilly and CompanyWhitney Stone, pt Arts administratorpublic defender for [employer].   CSW spoke with pt who agreed to add her to his ROI. CSW returned call, [phone].  She asked for update on his discharge status and CSW informed her that [place] [facility] [place] or [place]. Garner NashGregory Dezyrae Kensinger, MSW, LCSW Clinical Social Worker 06/26/2018 2:00 PM

## 2018-06-26 NOTE — Progress Notes (Signed)
Patient ID: Zachary Eaton, male   DOB: February 03, 1986, 33 y.o.   MRN: [id number] Patient transferred to the [facility] for continued treatment.  Patient was escorted by sheriff.  Patient remains bizarre and delusional upon transfer and exhibits need for further care.

## 2019-02-17 ENCOUNTER — Other Ambulatory Visit: Payer: Self-pay

## 2019-02-17 ENCOUNTER — Encounter (HOSPITAL_COMMUNITY): Payer: Self-pay | Admitting: Emergency Medicine

## 2019-02-17 ENCOUNTER — Encounter (HOSPITAL_COMMUNITY): Payer: Self-pay | Admitting: Psychiatry

## 2019-02-17 ENCOUNTER — Emergency Department (HOSPITAL_COMMUNITY)
Admission: EM | Admit: 2019-02-17 | Discharge: 2019-02-17 | Disposition: A | Discharge status: Other Acute Inpatient Hospital | Payer: No Typology Code available for payment source | Attending: Emergency Medicine | Admitting: Emergency Medicine

## 2019-02-17 ENCOUNTER — Inpatient Hospital Stay (HOSPITAL_COMMUNITY)
Admission: AD | Admit: 2019-02-17 | Discharge: 2019-03-07 | DRG: 885 | Disposition: A | Discharge status: Home / Self Care | Payer: No Typology Code available for payment source | Attending: Psychiatry | Admitting: Psychiatry

## 2019-02-17 DIAGNOSIS — F319 Bipolar disorder, unspecified: Secondary | ICD-10-CM | POA: Insufficient documentation

## 2019-02-17 DIAGNOSIS — Z20828 Contact with and (suspected) exposure to other viral communicable diseases: Secondary | ICD-10-CM | POA: Diagnosis present

## 2019-02-17 DIAGNOSIS — Z9114 Patient's other noncompliance with medication regimen: Secondary | ICD-10-CM

## 2019-02-17 DIAGNOSIS — R4689 Other symptoms and signs involving appearance and behavior: Secondary | ICD-10-CM | POA: Diagnosis present

## 2019-02-17 DIAGNOSIS — Z79899 Other long term (current) drug therapy: Secondary | ICD-10-CM | POA: Insufficient documentation

## 2019-02-17 DIAGNOSIS — F17210 Nicotine dependence, cigarettes, uncomplicated: Secondary | ICD-10-CM | POA: Diagnosis not present

## 2019-02-17 DIAGNOSIS — F4310 Post-traumatic stress disorder, unspecified: Secondary | ICD-10-CM | POA: Diagnosis present

## 2019-02-17 DIAGNOSIS — Z87820 Personal history of traumatic brain injury: Secondary | ICD-10-CM | POA: Insufficient documentation

## 2019-02-17 DIAGNOSIS — G4700 Insomnia, unspecified: Secondary | ICD-10-CM | POA: Diagnosis present

## 2019-02-17 DIAGNOSIS — F29 Unspecified psychosis not due to a substance or known physiological condition: Secondary | ICD-10-CM | POA: Diagnosis present

## 2019-02-17 DIAGNOSIS — Z046 Encounter for general psychiatric examination, requested by authority: Secondary | ICD-10-CM | POA: Diagnosis not present

## 2019-02-17 DIAGNOSIS — Z9119 Patient's noncompliance with other medical treatment and regimen: Secondary | ICD-10-CM

## 2019-02-17 LAB — LITHIUM LEVEL: Lithium Lvl: <0.06 mmol/L — ABNORMAL LOW (ref 0.60–1.20)

## 2019-02-17 LAB — COMPREHENSIVE METABOLIC PANEL
ALT: 19 U/L (ref 0–44)
AST: 23 U/L (ref 15–41)
Albumin: 5.5 g/dL — ABNORMAL HIGH (ref 3.5–5.0)
Alkaline Phosphatase: 58 U/L (ref 38–126)
Anion gap: 12 (ref 5–15)
BUN: 14 mg/dL (ref 6–20)
CO2: 28 mmol/L (ref 22–32)
Calcium: 9.3 mg/dL (ref 8.9–10.3)
Chloride: 97 mmol/L — ABNORMAL LOW (ref 98–111)
Creatinine, Ser: 1.09 mg/dL (ref 0.61–1.24)
GFR calc Af Amer: >60 mL/min (ref >60)
GFR calc non Af Amer: >60 mL/min (ref >60)
Glucose, Bld: 106 mg/dL — ABNORMAL HIGH (ref 70–99)
Potassium: 3.7 mmol/L (ref 3.5–5.1)
Sodium: 137 mmol/L (ref 135–145)
Total Bilirubin: 0.5 mg/dL (ref 0.3–1.2)
Total Protein: 8.8 g/dL — ABNORMAL HIGH (ref 6.5–8.1)

## 2019-02-17 LAB — CBC WITH DIFFERENTIAL/PLATELET
Abs Immature Granulocytes: 0.02 10*3/uL (ref 0.00–0.07)
Basophils Absolute: 0.1 10*3/uL (ref 0.0–0.1)
Basophils Relative: 1 %
Eosinophils Absolute: 0.1 10*3/uL (ref 0.0–0.5)
Eosinophils Relative: 1 %
HCT: 46 % (ref 39.0–52.0)
Hemoglobin: 15.1 g/dL (ref 13.0–17.0)
Immature Granulocytes: 0 %
Lymphocytes Relative: 27 %
Lymphs Abs: 2.6 10*3/uL (ref 0.7–4.0)
MCH: 29.8 pg (ref 26.0–34.0)
MCHC: 32.8 g/dL (ref 30.0–36.0)
MCV: 90.9 fL (ref 80.0–100.0)
Monocytes Absolute: 0.7 10*3/uL (ref 0.1–1.0)
Monocytes Relative: 7 %
Neutro Abs: 6.3 10*3/uL (ref 1.7–7.7)
Neutrophils Relative %: 64 %
Platelets: 281 10*3/uL (ref 150–400)
RBC: 5.06 MIL/uL (ref 4.22–5.81)
RDW: 11.6 % (ref 11.5–15.5)
WBC: 9.7 10*3/uL (ref 4.0–10.5)
nRBC: 0 % (ref 0.0–0.2)

## 2019-02-17 LAB — RAPID URINE DRUG SCREEN, HOSP PERFORMED
Amphetamines: NOT DETECTED
Barbiturates: NOT DETECTED
Benzodiazepines: NOT DETECTED
Cocaine: NOT DETECTED
Opiates: NOT DETECTED
Tetrahydrocannabinol: NOT DETECTED

## 2019-02-17 LAB — VALPROIC ACID LEVEL: Valproic Acid Lvl: <10 ug/mL — ABNORMAL LOW (ref 50.0–100.0)

## 2019-02-17 LAB — SARS CORONAVIRUS 2 BY RT PCR (HOSPITAL ORDER, PERFORMED IN ~~LOC~~ HOSPITAL LAB): SARS Coronavirus 2: NEGATIVE

## 2019-02-17 LAB — ETHANOL: Alcohol, Ethyl (B): <10 mg/dL (ref <10)

## 2019-02-17 MED ORDER — OMEGA-3-ACID ETHYL ESTERS 1 G PO CAPS
1.0000 g | ORAL_CAPSULE | Freq: Two times a day (BID) | ORAL | Status: DC
  Filled 2019-02-17 (×5): qty 1

## 2019-02-17 MED ORDER — DIVALPROEX SODIUM 125 MG PO CSDR
250.0000 mg | DELAYED_RELEASE_CAPSULE | Freq: Two times a day (BID) | ORAL | Status: DC
  Filled 2019-02-17 (×3): qty 2

## 2019-02-17 MED ORDER — ACETAMINOPHEN 325 MG PO TABS
650.0000 mg | ORAL_TABLET | Freq: Four times a day (QID) | ORAL | Status: DC | PRN
  Administered 2019-02-22: 650 mg via ORAL
  Filled 2019-02-17: qty 2

## 2019-02-17 MED ORDER — ALUM & MAG HYDROXIDE-SIMETH 200-200-20 MG/5ML PO SUSP: 30.0000 mL | ORAL | Status: DC | PRN

## 2019-02-17 MED ORDER — OMEGA-3-ACID ETHYL ESTERS 1 G PO CAPS
1.0000 g | ORAL_CAPSULE | Freq: Two times a day (BID) | ORAL | Status: DC
  Administered 2019-02-20 – 2019-03-07 (×29): 1 g via ORAL
  Filled 2019-02-17 (×42): qty 1

## 2019-02-17 MED ORDER — LITHIUM CARBONATE 150 MG PO CAPS
150.0000 mg | ORAL_CAPSULE | Freq: Every day | ORAL | Status: DC
  Filled 2019-02-17 (×2): qty 1

## 2019-02-17 MED ORDER — LITHIUM CARBONATE 150 MG PO CAPS
150.0000 mg | ORAL_CAPSULE | Freq: Every day | ORAL | Status: DC
  Filled 2019-02-17 (×3): qty 1

## 2019-02-17 MED ORDER — MAGNESIUM HYDROXIDE 400 MG/5ML PO SUSP: 30.0000 mL | Freq: Every day | ORAL | Status: DC | PRN

## 2019-02-17 MED ORDER — DIVALPROEX SODIUM 125 MG PO CSDR
250.0000 mg | DELAYED_RELEASE_CAPSULE | Freq: Two times a day (BID) | ORAL | Status: DC
  Filled 2019-02-17 (×5): qty 2

## 2019-02-17 NOTE — ED Notes (Signed)
Pt's mother dropped off a packet with several letters in it as well as photos of his room which was destroyed. Spoke with Judson Roch at [facility] who requests this be sent with him.

## 2019-02-17 NOTE — Progress Notes (Signed)
Items in the locker: 1 vapor, 1 belt, 1 pair od sneakers with strings, cell phone, watch, wallet, [place], [place], [place], [place], [place], [place], [place] ID (2).

## 2019-02-17 NOTE — ED Notes (Addendum)
Pt being uncooperative with edp and staff

## 2019-02-17 NOTE — Plan of Care (Signed)
Newly admitted and adjusting to the unit. Cooperative per encouragements. Thought process is organized with episodes of los of focus. Irritable when discussing his metal health problem. Guarded and minimizing treatment needs. No aggressive behaviors noted at this moment. Staff continue to monitor.

## 2019-02-17 NOTE — ED Notes (Signed)
Pt refusing to eat. Is polite and cooperative.

## 2019-02-17 NOTE — BHH Counselor (Signed)
At [place], Clinician spoke to Vivien Rota, RN pt's IVC to be faxed. Once IVC paperwork is received clinician to complete pt's TTS assessment.    Vertell Novak, [place], [facility], [facility] Triage Specialist [phone]

## 2019-02-17 NOTE — Progress Notes (Signed)
Patient ID: Zachary Eaton, male   DOB: 1985/10/16, 33 y.o.   MRN: [id number] Patient presents involuntarily secondary to increased aggressive behaviors toward family. Patient is a veteran with  history of PTSD and has not been taking medications as prescribed. Upon assessment, patient is guarded and refusing to participate. "I don't even know why they brought me here". Patient is minimizing the problem. Admits to using alcohol and reports that he drinks heavily "depends on what's going on". Patient is a smoker as well and not ready to quit. Declined smoking cessation information. Thought process is clear mostly, but patient has poor concentration: walked out of the session multiple times and had to be redirected. He is irritable especially when asked about his metal health situation. Skin assessment performed and no issues noted. Patient was oriented to the unit and safety precautions initiated.

## 2019-02-17 NOTE — Tx Team (Signed)
Initial Treatment Plan 02/17/2019 7:59 PM Zachary Eaton ZRA:[id number]    PATIENT STRESSORS: Substance abuse Traumatic event   PATIENT STRENGTHS: Ability for insight Communication skills General fund of knowledge Supportive family/friends   PATIENT IDENTIFIED PROBLEMS: Disturbed thought process  Medication non adherence  History of trauma  Aggressive behaviors               DISCHARGE CRITERIA:  Ability to meet basic life and health needs Adequate post-discharge living arrangements Improved stabilization in mood, thinking, and/or behavior Motivation to continue treatment in a less acute level of care  PRELIMINARY DISCHARGE PLAN: Outpatient therapy Participate in family therapy Return to previous living arrangement Return to previous work or school arrangements  PATIENT/FAMILY INVOLVEMENT: This treatment plan has been presented to and reviewed with the patient, Zachary Eaton.  The patient has been given the opportunity to ask questions and make suggestions.  Ronelle Nigh, RN 02/17/2019, 7:59 PM

## 2019-02-17 NOTE — ED Notes (Signed)
Pt now being cooperative with edp

## 2019-02-17 NOTE — ED Notes (Signed)
IVC paperwork faxed to [facility] at this time

## 2019-02-17 NOTE — Progress Notes (Signed)
Note: Patient is alert and oriented x 4.  Presents with anxious affect and mood.  Skin assessment and personal belongings completed.  Skin is dry and intact.  No contraband found.  Admission plan of care reviewed and consent signed.  Patient oriented to the unit, staff and room.  Routine safety checks initiated.  Patient is safe on the unit.

## 2019-02-17 NOTE — ED Notes (Signed)
Pt stating that people are trying to control him and that his parents are trying to get rid of him

## 2019-02-17 NOTE — Progress Notes (Signed)
Pt accepted to [facility]; 59-2   Dr. Parke Poisson is the attending provider.    Call report to [phone]    RN @ [facility] [place] ED notified.     Pt is involuntary and will be transported by law enforcement    Pt is scheduled to arrive at [facility] as soon as his Covid test results return as negative.   Audree Camel, LCSW, [place] Disposition [facility] BHH/TTS [phone]

## 2019-02-17 NOTE — Progress Notes (Signed)
Pt paranoid on the unit, pt stated he would think about taking his medications. Pt very watchful on the unit.

## 2019-02-17 NOTE — ED Notes (Signed)
Security at bedside

## 2019-02-17 NOTE — ED Notes (Signed)
EDP talking to patient

## 2019-02-17 NOTE — ED Notes (Signed)
[employer] with pt
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
## 2019-02-17 NOTE — ED Notes (Signed)
[facility] called and stated pt meets inpatient criteria and spoke with EDP at this time.

## 2019-02-17 NOTE — Progress Notes (Signed)
Adult Psychoeducational Group Note  Date:  02/17/2019 Time:  10:54 PM  Group Topic/Focus:  Wrap-Up Group:   The focus of this group is to help patients review their daily goal of treatment and discuss progress on daily workbooks.  Participation Level:  Active  Participation Quality:  Appropriate  Affect:  Blunted, Defensive, Depressed and Flat  Cognitive:  Disorganized and Confused  Insight: Lacking and Limited  Engagement in Group:  Lacking, Limited, Poor and Resistant  Modes of Intervention:  Discussion  Additional Comments:  Pt stated his goal for today was to focus on her treatment plan. Pt stated he felt he accomplished his goal today. Pt stated his relationship with his family has improved since he was admitted here. Pt stated he felt about the same about himself today. Pt rated his overall day a 2 out of 10. Pt stated his appetite was poor today. Pt stated his goal for tonight is to get some rest. Pt did not complain of pain tonight.  Pt stated he was not hearing or seeing anything that was not there. Pt stated he had no thoughts of harming himself or others. Pt stated he would alert staff if anything changes.  Candy Sledge 02/17/2019, 10:54 PM
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
## 2019-02-18 DIAGNOSIS — F250 Schizoaffective disorder, bipolar type: Secondary | ICD-10-CM

## 2019-02-18 LAB — LIPID PANEL
Cholesterol: 138 mg/dL (ref 0–200)
HDL: 44 mg/dL (ref >40)
LDL Cholesterol: 80 mg/dL (ref 0–99)
Total CHOL/HDL Ratio: 3.1 RATIO
Triglycerides: 71 mg/dL (ref <150)
VLDL: 14 mg/dL (ref 0–40)

## 2019-02-18 LAB — TSH: TSH: 0.717 u[IU]/mL (ref 0.350–4.500)

## 2019-02-18 LAB — HEMOGLOBIN A1C
Hgb A1c MFr Bld: 4.9 % (ref 4.8–5.6)
Mean Plasma Glucose: 93.93 mg/dL

## 2019-02-18 MED ORDER — DIVALPROEX SODIUM 500 MG PO DR TAB
500.0000 mg | DELAYED_RELEASE_TABLET | Freq: Two times a day (BID) | ORAL | Status: DC
  Administered 2019-02-20 – 2019-02-21 (×3): 500 mg via ORAL
  Filled 2019-02-18 (×8): qty 1

## 2019-02-18 MED ORDER — DIVALPROEX SODIUM 250 MG PO DR TAB
250.0000 mg | DELAYED_RELEASE_TABLET | Freq: Two times a day (BID) | ORAL | Status: DC
  Filled 2019-02-18 (×3): qty 1

## 2019-02-18 MED ORDER — LITHIUM CARBONATE 300 MG PO CAPS
300.0000 mg | ORAL_CAPSULE | Freq: Every day | ORAL | Status: DC
  Administered 2019-02-20 – 2019-02-21 (×2): 300 mg via ORAL
  Filled 2019-02-18 (×5): qty 1

## 2019-02-18 MED ORDER — RISPERIDONE 3 MG PO TABS
3.0000 mg | ORAL_TABLET | Freq: Two times a day (BID) | ORAL | Status: DC
  Administered 2019-02-19 – 2019-02-21 (×4): 3 mg via ORAL
  Filled 2019-02-18 (×11): qty 1

## 2019-02-18 MED ORDER — ENSURE ENLIVE PO LIQD
237.0000 mL | Freq: Two times a day (BID) | ORAL | Status: DC
  Administered 2019-02-19 – 2019-03-03 (×17): 237 mL via ORAL

## 2019-02-18 MED ORDER — TEMAZEPAM 30 MG PO CAPS
30.0000 mg | ORAL_CAPSULE | Freq: Every day | ORAL | Status: DC
  Administered 2019-02-20 – 2019-03-06 (×15): 30 mg via ORAL
  Filled 2019-02-18 (×14): qty 1

## 2019-02-18 MED ORDER — PALIPERIDONE PALMITATE ER 234 MG/1.5ML IM SUSY
234.0000 mg | PREFILLED_SYRINGE | Freq: Once | INTRAMUSCULAR | Status: DC
  Filled 2019-02-18: qty 1.5
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
## 2019-02-18 NOTE — Progress Notes (Signed)
Patient has been in bed most of the day and also stays in his room when he is not in bed.  Patient has refused all medications today.  Staff has talked to patient throughout the day and patient continues to refuse medications. Emotional support and encouragement has been given to patient many times throughout the day. Safety maintained with 15 minute checks.

## 2019-02-18 NOTE — BHH Suicide Risk Assessment (Signed)
[facility] Admission Suicide Risk Assessment   Nursing information obtained from:  Patient Demographic factors:  Male, Caucasian Current Mental Status:  NA Loss Factors:  NA Historical Factors:  NA Risk Reduction Factors:  NA  Total Time spent with patient: 45 minutes Principal Problem: Psychotic disorder Diagnosis:  Active Problems:   Psychosis ([place])  Subjective Data: Noncompliant with meds  Continued Clinical Symptoms:  Alcohol Use Disorder Identification Test Final Score (AUDIT): 8 The "Alcohol Use Disorders Identification Test", Guidelines for Use in Primary Care, Second Edition.  World Pharmacologist [facility]). Score between 0-7:  no or low risk or alcohol related problems. Score between 8-15:  moderate risk of alcohol related problems. Score between 16-19:  high risk of alcohol related problems. Score 20 or above:  warrants further diagnostic evaluation for alcohol dependence and treatment.   CLINICAL FACTORS:   Currently Psychotic Musculoskeletal: Strength & Muscle Tone: within normal limits Gait & Station: normal Patient leans: N/A  Psychiatric Specialty Exam: Physical Exam  Nursing note and vitals reviewed. Constitutional: He appears well-developed and well-nourished.  Cardiovascular: Normal rate and regular rhythm.    Review of Systems  Constitutional: Negative.   Eyes: Negative.   Cardiovascular: Negative.   Gastrointestinal: Negative.   Genitourinary: Negative.   Neurological: Negative.   Endo/Heme/Allergies: Negative.   TBI's x3-4  Blood pressure 103/65, pulse (!) 56, temperature 97.8 F (36.6 C), temperature source Oral, resp. rate 16, height 5\' 11"  (1.803 m), weight 79.4 kg.Body mass index is 24.41 kg/m.  General Appearance: Guarded  Eye Contact:  Poor  Speech:  Slow  Volume:  Decreased  Mood:  Dysphoric  Affect:  Blunt  Thought Process:  Disorganized, Irrelevant and Descriptions of Associations: Circumstantial  Orientation:  Full (Time, Place, and  Person)  Thought Content:  Hallucinations: Auditory  Suicidal Thoughts:  No  Homicidal Thoughts:  No  Memory:  Immediate;   Poor Recent;   Poor Remote;   Fair  Judgement:  Impaired  Insight:  Lacking  Psychomotor Activity:  Normal  Concentration:  Concentration: Fair and Attention Span: Poor  Recall:  [place] of Knowledge:  Good  Language:  Good  Akathisia:  Negative  Handed:  Right  AIMS (if indicated):     Assets:  Leisure Time Physical Health Resilience Social Support  ADL's:  Intact  Cognition:  WNL  Sleep:  Number of Hours: 5.5       COGNITIVE FEATURES THAT CONTRIBUTE TO RISK:  Loss of executive function    SUICIDE RISK:   Mild:  Suicidal ideation of limited frequency, intensity, duration, and specificity.  There are no identifiable plans, no associated intent, mild dysphoria and related symptoms, good self-control (both objective and subjective assessment), few other risk factors, and identifiable protective factors, including available and accessible social support.  PLAN OF CARE: see eval  I certify that inpatient services furnished can reasonably be expected to improve the patient's condition.   [name], MD 02/18/2019, 10:51 AM

## 2019-02-18 NOTE — Progress Notes (Signed)
Patient refused medicines this morning.  People put me here and a judge signed the papers that does not know anything about me.  Patient refused invega IM.  I'm not playing your games.  You won't let me out of here.   Patient sitting in room eating lunch with jacket and hood over his head.

## 2019-02-18 NOTE — Progress Notes (Signed)
Adult Psychoeducational Group Note  Date:  02/18/2019 Time:  10:49 PM  Group Topic/Focus:  Wrap-Up Group:   The focus of this group is to help patients review their daily goal of treatment and discuss progress on daily workbooks.  Participation Level:  Active  Participation Quality:  Appropriate  Affect:  Appropriate  Cognitive:  Appropriate  Insight: Appropriate  Engagement in Group:  Developing/Improving  Modes of Intervention:  Discussion  Additional Comments: Pt stated his goal for today was to focus on her treatment plan. Pt stated he felt he accomplished his goal today. Pt stated his relationship with his family has improved since he was admitted here. Pt stated he felt better about himself today. Pt rated his overall day a 4 out of 10. Pt stated his appetite was improved today. Pt stated his goal for tonight is to get some rest. Pt did not complain of pain tonight.  Pt stated he was not hearing or seeing anything that was not there. Pt stated he had no thoughts of harming himself or others. Pt stated he would alert staff if anything changes.   Candy Sledge 02/18/2019, 10:49 PM

## 2019-02-18 NOTE — Progress Notes (Signed)
"  People don't understand me.  I haven't taken any medicine since I have been here."  Nurse explained to patient that medication will help him so he can discharge.  Patient continued to talk about not being understood.

## 2019-02-18 NOTE — Progress Notes (Signed)
Patient continues to refuse all medications today

## 2019-02-18 NOTE — Progress Notes (Signed)
NUTRITION ASSESSMENT  Pt identified as at risk on the Malnutrition Screen Tool  INTERVENTION: 1. Supplements: Ensure Enlive po BID, each supplement provides 350 kcal and 20 grams of protein  NUTRITION DIAGNOSIS: Unintentional weight loss related to sub-optimal intake as evidenced by pt report.   Goal: Pt to meet >/= 90% of their estimated nutrition needs.  Monitor:  PO intake  Assessment:  Pt with history of PTSD. Currently admitted for psychosis. Pt reports poor appetite and has not been eating well recently. No weight loss noted in weight records. Will order Ensure supplements given poor appetite.    Height: Ht Readings from Last 1 Encounters:  02/17/19 5\' 11"  (1.803 m)    Weight: Wt Readings from Last 1 Encounters:  02/17/19 79.4 kg    Weight Hx: Wt Readings from Last 10 Encounters:  02/17/19 79.4 kg  02/17/19 79.4 kg  06/12/18 74.8 kg  06/11/18 79.4 kg  11/11/17 77.1 kg  07/17/17 77.1 kg    BMI:  Body mass index is 24.41 kg/m. Pt meets criteria for normal based on current BMI.  Estimated Nutritional Needs: Kcal: 25-30 kcal/kg Protein: > 1 gram protein/kg Fluid: 1 ml/kcal  Diet Order:  Diet Order            Diet regular Room service appropriate? No; Fluid consistency: Thin; Fluid restriction: 2000 mL Fluid  Diet effective now             Pt is also offered choice of unit snacks mid-morning and mid-afternoon.  Pt is eating as desired.   Lab results and medications reviewed.   Clayton Bibles, MS, RD, LDN Inpatient Clinical Dietitian Pager: [phone] After Hours Pager: [phone]

## 2019-02-18 NOTE — Progress Notes (Addendum)
Recreation Therapy Notes  Patient admitted to unit 10.5.20. Due to admission within last year, no new assessment conducted at this time. Last assessment conducted 1.30.20. Patient reports stressor as "trying to get over some stuff" and "living situation".  Pt reports reason for current admission was being IVC'd.  Pt stated he likes to go snowboarding but hasn't gone in awhile.  Pt stated strengths are still being stubborn and resilient.  Pt area of improvement is communication.      Patient denies SI, HI, AVH at this time. Patient reports goal of getting out of the hospital.  Information found below from assessment conducted 1.30.20  Coping Skills: Isolation, Journal, Sports, TV, Aggression, Music, Exercise, Meditate, Deep Breathing, Talk, Prayer, Art, Avoidance, Read, Hot Bath/Shower  Strengths: Stubborn; Resilience    Rohan Juenger Ria Comment, LRT/CTRS   Victorino Sparrow A 02/18/2019 1:03 PM

## 2019-02-19 DIAGNOSIS — F250 Schizoaffective disorder, bipolar type: Secondary | ICD-10-CM | POA: Diagnosis not present

## 2019-02-19 LAB — PROLACTIN: Prolactin: 27.8 ng/mL — ABNORMAL HIGH (ref 4.0–15.2)

## 2019-02-19 MED ORDER — ZIPRASIDONE MESYLATE 20 MG IM SOLR
20.0000 mg | INTRAMUSCULAR | Status: AC | PRN
  Administered 2019-02-19: 16:00:00 20 mg via INTRAMUSCULAR
  Filled 2019-02-19: qty 20

## 2019-02-19 MED ORDER — LORAZEPAM 1 MG PO TABS: 1.0000 mg | ORAL_TABLET | ORAL | Status: DC | PRN

## 2019-02-19 MED ORDER — HALOPERIDOL LACTATE 5 MG/ML IJ SOLN
10.0000 mg | Freq: Two times a day (BID) | INTRAMUSCULAR | Status: DC | PRN
  Administered 2019-02-19: 16:00:00 10 mg via INTRAMUSCULAR
  Filled 2019-02-19: qty 2

## 2019-02-19 MED ORDER — OLANZAPINE 10 MG PO TBDP
10.0000 mg | ORAL_TABLET | Freq: Three times a day (TID) | ORAL | Status: DC | PRN
  Administered 2019-02-21: 10 mg via ORAL
  Filled 2019-02-19: qty 1

## 2019-02-19 NOTE — Progress Notes (Signed)
D: Pt continues to be paranoid with minimal interaction. Pt appears pleasant while in the hospital and continues to believe he does not need medications A: Pt was offered support and encouragement.  Pt was encourage to attend groups. Q 15 minute checks were done for safety.  R: safety maintained on unit.
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
## 2019-02-19 NOTE — Progress Notes (Signed)
D: Pt denies SI/HI/AVH. Pt is pleasant and cooperative. Pt continues to appear to be responding to internal stimuli. Pt paranoid about his surroundings and paranoid about taking medications due to how it makes him feel. Pt stated he would try the medications tonight, but changed his mind.   A: Pt was offered support and encouragement.  Pt was encourage to attend groups. Q 15 minute checks were done for safety.  R: safety maintained on unit.

## 2019-02-19 NOTE — Progress Notes (Signed)
Recreation Therapy Notes  Date: 10.7.20 Time: 0930 Location: 400 Hall Dayroom  Group Topic: Communication  Goal Area(s) Addresses:  Patient will effectively communicate with peers in group.  Patient will verbalize benefit of healthy communication. Patient will verbalize positive effect of healthy communication on post d/c goals.  Patient will identify communication techniques that made activity effective for group.   Intervention: Drawings, pencils, blank paper   Activity: Geometrical Drawings.  One patient was given a drawing to describe to the group.  The remainder of the group was to draw the picture as it was described to them by the presenter.  The presenter can get as detailed as needed.  The group can not ask any detailed questions.  They can only ask the speaker to repeat themselves.  Education: Communication, Discharge Planning  Education Outcome: Acknowledges understanding/In group clarification offered/Needs additional education.   Clinical Observations/Feedback:  Pt did not attend group.     Auri Jahnke, LRT/CTRS         Hung Rhinesmith A 02/19/2019 10:50 AM 

## 2019-02-19 NOTE — Tx Team (Addendum)
Interdisciplinary Treatment and Diagnostic Plan Update  02/19/2019 Time of Session: 9:00am Zachary Eaton MRN: [id number]  Principal Diagnosis: <principal problem not specified>  Secondary Diagnoses: Active Problems:   Psychosis ([place])   Current Medications:  Current Facility-Administered Medications  Medication Dose Route Frequency Provider Last Rate Last Dose  . acetaminophen (TYLENOL) tablet 650 mg  650 mg Oral Q6H PRN [name], NP      . alum & mag hydroxide-simeth (MAALOX/MYLANTA) 200-200-20 MG/5ML suspension 30 mL  30 mL Oral Q4H PRN [name], NP      . divalproex (DEPAKOTE) DR tablet 500 mg  500 mg Oral Q12H [name], MD      . feeding supplement (ENSURE ENLIVE) (ENSURE ENLIVE) liquid 237 mL  237 mL Oral BID BM [name], MD      . lithium carbonate capsule 300 mg  300 mg Oral QHS [name], MD      . magnesium hydroxide (MILK OF MAGNESIA) suspension 30 mL  30 mL Oral Daily PRN [name], NP      . omega-3 acid ethyl esters (LOVAZA) capsule 1 g  1 g Oral BID [name], NP      . paliperidone (INVEGA SUSTENNA) injection 234 mg  234 mg Intramuscular Once [name], MD      . risperiDONE (RISPERDAL) tablet 3 mg  3 mg Oral BID [name], MD      . temazepam (RESTORIL) capsule 30 mg  30 mg Oral QHS [name], MD       PTA Medications: Medications Prior to Admission  Medication Sig Dispense Refill Last Dose  . divalproex (DEPAKOTE SPRINKLE) 125 MG capsule Take 2 capsules (250 mg total) by mouth every 12 (twelve) hours. (Patient not taking: Reported on 02/18/2019) 60 capsule 1 Not Taking at Unknown time  . lithium carbonate 150 MG capsule Take 1 capsule (150 mg total) by mouth at bedtime. (Patient not taking: Reported on 02/18/2019) 60 capsule 1 Not Taking at Unknown time  . omega-3 acid ethyl esters (LOVAZA) 1 g capsule Take 1 capsule (1 g total) by mouth 2 (two) times daily. (Patient not taking: Reported on 02/18/2019) 60 capsule 1 Not  Taking at Unknown time    Patient Stressors: Substance abuse Traumatic event  Patient Strengths: Ability for insight Communication skills General fund of knowledge Supportive family/friends  Treatment Modalities: Medication Management, Group therapy, Case management,  1 to 1 session with clinician, Psychoeducation, Recreational therapy.   Physician Treatment Plan for Primary Diagnosis: <principal problem not specified> Long Term Goal(s): Improvement in symptoms so as ready for discharge Improvement in symptoms so as ready for discharge   Short Term Goals: Ability to identify changes in lifestyle to reduce recurrence of condition will improve Ability to verbalize feelings will improve Ability to disclose and discuss suicidal ideas Ability to demonstrate self-control will improve Ability to identify and develop effective coping behaviors will improve Ability to verbalize feelings will improve Ability to disclose and discuss suicidal ideas Ability to demonstrate self-control will improve Ability to identify and develop effective coping behaviors will improve  Medication Management: Evaluate patient's response, side effects, and tolerance of medication regimen.  Therapeutic Interventions: 1 to 1 sessions, Unit Group sessions and Medication administration.  Evaluation of Outcomes: Not Met  Physician Treatment Plan for Secondary Diagnosis: Active Problems:   Psychosis ([place])  Long Term Goal(s): Improvement in symptoms so as ready for discharge Improvement in symptoms so as ready for discharge   Short Term Goals: Ability to  identify changes in lifestyle to reduce recurrence of condition will improve Ability to verbalize feelings will improve Ability to disclose and discuss suicidal ideas Ability to demonstrate self-control will improve Ability to identify and develop effective coping behaviors will improve Ability to verbalize feelings will improve Ability to disclose and  discuss suicidal ideas Ability to demonstrate self-control will improve Ability to identify and develop effective coping behaviors will improve     Medication Management: Evaluate patient's response, side effects, and tolerance of medication regimen.  Therapeutic Interventions: 1 to 1 sessions, Unit Group sessions and Medication administration.  Evaluation of Outcomes: Not Met   RN Treatment Plan for Primary Diagnosis: <principal problem not specified> Long Term Goal(s): Knowledge of disease and therapeutic regimen to maintain health will improve  Short Term Goals: Ability to demonstrate self-control, Ability to identify and develop effective coping behaviors will improve and Compliance with prescribed medications will improve  Medication Management: RN will administer medications as ordered by provider, will assess and evaluate patient's response and provide education to patient for prescribed medication. RN will report any adverse and/or side effects to prescribing provider.  Therapeutic Interventions: 1 on 1 counseling sessions, Psychoeducation, Medication administration, Evaluate responses to treatment, Monitor vital signs and CBGs as ordered, Perform/monitor CIWA, COWS, AIMS and Fall Risk screenings as ordered, Perform wound care treatments as ordered.  Evaluation of Outcomes: Not Met   LCSW Treatment Plan for Primary Diagnosis: <principal problem not specified> Long Term Goal(s): Safe transition to appropriate next level of care at discharge, Engage patient in therapeutic group addressing interpersonal concerns.  Short Term Goals: Engage patient in aftercare planning with referrals and resources, Increase social support, Increase emotional regulation, Identify triggers associated with mental health/substance abuse issues and Increase skills for wellness and recovery  Therapeutic Interventions: Assess for all discharge needs, 1 to 1 time with Social worker, Explore available  resources and support systems, Assess for adequacy in community support network, Educate family and significant other(s) on suicide prevention, Complete Psychosocial Assessment, Interpersonal group therapy.  Evaluation of Outcomes: Not Met   Progress in Treatment: Attending groups: Yes. Participating in groups: Yes. Taking medication as prescribed: Yes. Toleration medication: Yes. Family/Significant other contact made: No, will contact:  parents, Threasa Beards or Ronalee Belts Patient understands diagnosis: No. Discussing patient identified problems/goals with staff: Yes. Medical problems stabilized or resolved: No. Denies suicidal/homicidal ideation: No. Issues/concerns per patient self-inventory: Yes.  New problem(s) identified: Yes, Describe:  Violence toward family.  New Short Term/Long Term Goal(s): medication management for mood stabilization; elimination of SI thoughts; development of comprehensive mental wellness/sobriety plan.  Patient Goals:  "Move forward."  Discharge Plan or Barriers: Expected to follow up with providers at the [facility].  Reason for Continuation of Hospitalization: Aggression Anxiety Depression Hallucinations Homicidal ideation  Estimated Length of Stay: 5-7 days  Attendees: Patient: Zachary Eaton 02/19/2019 9:56 AM  Physician: Dr.Farah 02/19/2019 9:56 AM  Nursing: Vladimir Faster, RN 02/19/2019 9:56 AM  RN Care Manager: 02/19/2019 9:56 AM  Social Worker: Stephanie Acre, [place] 02/19/2019 9:56 AM  Recreational Therapist:  02/19/2019 9:56 AM  Other:  02/19/2019 9:56 AM  Other:  02/19/2019 9:56 AM  Other: 02/19/2019 9:56 AM    Scribe for Treatment Team: Joellen Jersey, [place] 02/19/2019 9:56 AM

## 2019-02-20 DIAGNOSIS — F250 Schizoaffective disorder, bipolar type: Secondary | ICD-10-CM | POA: Diagnosis not present

## 2019-02-20 MED ORDER — NICOTINE POLACRILEX 2 MG MT GUM
2.0000 mg | CHEWING_GUM | OROMUCOSAL | Status: DC | PRN
  Administered 2019-02-21 – 2019-03-06 (×20): 2 mg via ORAL
  Filled 2019-02-20 (×7): qty 1
  Filled 2019-02-20: qty 9

## 2019-02-20 NOTE — Progress Notes (Signed)
Pt focused on D/C , pt has been compliant with Tx. Pt understands he may be here through the weekend and he understood and stated he was going to continue to comply with the medications. Pt encouraged to let the doctor know if he has any issues.

## 2019-02-20 NOTE — [facility] Suicide Risk Assessment (Signed)
[place] INPATIENT:  Family/Significant Other Suicide Prevention Education  Suicide Prevention Education:  Education Completed; mother, Hulan Szumski [phone]) has been identified by the patient as the family member/significant other with whom the patient will be residing, and identified as the person(s) who will aid the patient in the event of a mental health crisis (suicidal ideations/suicide attempt).  With written consent from the patient, the family member/significant other has been provided the following suicide prevention education, prior to the and/or following the discharge of the patient.  The suicide prevention education provided includes the following:  Suicide risk factors  Suicide prevention and interventions  National Suicide Hotline telephone number  [facility] assessment telephone number  [facility] Emergency Assistance [place] and/or Residential Mobile Crisis Unit telephone number  Request made of family/significant other to:  Remove weapons (e.g., guns, rifles, knives), all items previously/currently identified as safety concern.    Remove drugs/medications (over-the-counter, prescriptions, illicit drugs), all items previously/currently identified as a safety concern.  The family member/significant other verbalizes understanding of the suicide prevention education information provided.  The family member/significant other agrees to remove the items of safety concern listed above.  Mother reports that she has removed all guns and weapons from the home but is fearful that the patient will be angry about this and react negatively.  Mother expressed several concerns regarding safety and the patient being destructive in the home. She initially stated that the patient could not return home, but later seemed agreeable to the patient returning after some discussion with the expectation that the patient would be medically stabilized.  Mother  stated her sister is a Education officer, museum in another state and was told that [facility] would work with the [place] to secure veteran's housing. CSW explained that although there are housing programs, this is often a time consuming process and may be an option for long-term planning.   Mother inquired about the patient remaining inpatient for an additional 30 days, CSW explained this is highly unlikely and that [facility] is an acute-care crisis-stabilization facility.  Mother was appreciative of call, but she requested follow up from [place] and Dr.Farah prior to discharge "to know what to expect."  Joellen Jersey 02/20/2019, 3:50 PM

## 2019-02-20 NOTE — Progress Notes (Signed)
[facility] MD Progress Note  02/20/2019 10:00 AM Zachary Eaton  MRN:  [id number] Subjective:    Patient in bed he answers questions so minimally is to be meaningless but he is alert he is oriented to person place and situation but will not elaborate date or time.  States he is taking his medications then states he has not been taking them gives me contradictory information but of course we have the forced medication order and he did receive 1 injection of Haldol followed by some oral medications.  Again lacking insight we discussed the destruction of property at home and he denies that he did this despite photographic evidence  Principal Problem: psychosis Diagnosis: Active Problems:   Psychosis (HCC)  Total Time spent with patient: 20 minutes  Past Psychiatric History: see eval  Past Medical History:  Past Medical History:  Diagnosis Date  . Migraines   . Traumatic brain injury [facility])     Past Surgical History:  Procedure Laterality Date  . ANKLE SURGERY Left    Family History: History reviewed. No pertinent family history. Family Psychiatric  History: see eval Social History:  Social History   Substance and Sexual Activity  Alcohol Use Yes   Comment: occ     Social History   Substance and Sexual Activity  Drug Use Not Currently    Social History   Socioeconomic History  . Marital status: Single    Spouse name: Not on file  . Number of children: Not on file  . Years of education: Not on file  . Highest education level: Not on file  Occupational History  . Not on file  Social Needs  . Financial resource strain: Not on file  . Food insecurity    Worry: Not on file    Inability: Not on file  . Transportation needs    Medical: Not on file    Non-medical: Not on file  Tobacco Use  . Smoking status: Current Some Day Smoker    Packs/day: 2.00    Types: Cigarettes  . Smokeless tobacco: Current User    Types: Chew  . Tobacco comment: patient declined  Substance  and Sexual Activity  . Alcohol use: Yes    Comment: occ  . Drug use: Not Currently  . Sexual activity: Not Currently  Lifestyle  . Physical activity    Days per week: Not on file    Minutes per session: Not on file  . Stress: Not on file  Relationships  . Social Musician on phone: Not on file    Gets together: Not on file    Attends religious service: Not on file    Active member of club or organization: Not on file    Attends meetings of clubs or organizations: Not on file    Relationship status: Not on file  Other Topics Concern  . Not on file  Social History Narrative  . Not on file   Additional Social History:                         Sleep: Fair  Appetite:  Fair  Current Medications: Current Facility-Administered Medications  Medication Dose Route Frequency Provider Last Rate Last Dose  . acetaminophen (TYLENOL) tablet 650 mg  650 mg Oral Q6H PRN Denzil Magnuson, NP      . alum & mag hydroxide-simeth (MAALOX/MYLANTA) 200-200-20 MG/5ML suspension 30 mL  30 mL Oral Q4H PRN Denzil Magnuson, NP      .  divalproex (DEPAKOTE) DR tablet 500 mg  500 mg Oral Q12H Malvin JohnsFarah, Vance Belcourt, MD   500 mg at 02/20/19 0856  . feeding supplement (ENSURE ENLIVE) (ENSURE ENLIVE) liquid 237 mL  237 mL Oral BID BM Malvin JohnsFarah, Shekinah Pitones, MD   237 mL at 02/19/19 1630  . haloperidol lactate (HALDOL) injection 10 mg  10 mg Intramuscular BID PRN Malvin JohnsFarah, Hartley Wyke, MD   10 mg at 02/19/19 1628  . lithium carbonate capsule 300 mg  300 mg Oral QHS Malvin JohnsFarah, Juanelle Trueheart, MD      . OLANZapine zydis [facility](ZYPREXA) disintegrating tablet 10 mg  10 mg Oral Q8H PRN Antonieta Pertlary, Greg Lawson, MD       And  . LORazepam (ATIVAN) tablet 1 mg  1 mg Oral PRN Antonieta Pertlary, Greg Lawson, MD      . magnesium hydroxide (MILK OF MAGNESIA) suspension 30 mL  30 mL Oral Daily PRN Denzil Magnusonhomas, Lashunda, NP      . omega-3 acid ethyl esters (LOVAZA) capsule 1 g  1 g Oral BID Denzil Magnusonhomas, Lashunda, NP   1 g at 02/20/19 0856  . paliperidone (INVEGA SUSTENNA)  injection 234 mg  234 mg Intramuscular Once Malvin JohnsFarah, Darrielle Pflieger, MD      . risperiDONE (RISPERDAL) tablet 3 mg  3 mg Oral BID Malvin JohnsFarah, Tannah Dreyfuss, MD   3 mg at 02/20/19 0856  . temazepam (RESTORIL) capsule 30 mg  30 mg Oral QHS Malvin JohnsFarah, Dekendrick Uzelac, MD        Lab Results: No results found for this or any previous visit (from the past 48 hour(s)).  Blood Alcohol level:  Lab Results  Component Value Date   ETH <10 02/17/2019   ETH <10 06/11/2018    Metabolic Disorder Labs: Lab Results  Component Value Date   HGBA1C 4.9 02/18/2019   MPG 93.93 02/18/2019   Lab Results  Component Value Date   PROLACTIN 27.8 (H) 02/18/2019   Lab Results  Component Value Date   CHOL 138 02/18/2019   TRIG 71 02/18/2019   HDL 44 02/18/2019   CHOLHDL 3.1 02/18/2019   VLDL 14 02/18/2019   LDLCALC 80 02/18/2019    Physical Findings: AIMS: Facial and Oral Movements Muscles of Facial Expression: None, normal Lips and Perioral Area: None, normal Jaw: None, normal Tongue: None, normal,Extremity Movements Upper (arms, wrists, hands, fingers): None, normal Lower (legs, knees, ankles, toes): None, normal, Trunk Movements Neck, shoulders, hips: None, normal, Overall Severity Severity of abnormal movements (highest score from questions above): None, normal Incapacitation due to abnormal movements: None, normal Patient's awareness of abnormal movements (rate only patient's report): No Awareness, Dental Status Current problems with teeth and/or dentures?: No Does patient usually wear dentures?: No  CIWA:  CIWA-Ar Total: 1 COWS:  COWS Total Score: 1  Musculoskeletal: Strength & Muscle Tone: within normal limits Gait & Station: normal Patient leans: N/A  Psychiatric Specialty Exam: Physical Exam  ROS  Blood pressure 127/84, pulse 71, temperature 97.8 F (36.6 C), temperature source Oral, resp. rate 16, height 5\' 11"  (1.803 m), weight 79.4 kg.Body mass index is 24.41 kg/m.  General Appearance: Casual and Disheveled   Eye Contact:  nil  Speech:  Slow  Volume:  Decreased  Mood:  Dysphoric  Affect:  Blunt  Thought Process:  Irrelevant and Descriptions of Associations: Circumstantial  Orientation:  Full (Time, Place, and Person)/presumed  Thought Content:  Illogical  Suicidal Thoughts:  No  Homicidal Thoughts:  No  Memory:  Immediate;   Fair Recent;   Poor Remote;   Fair  Judgement:  Impaired  Insight:  Lacking  Psychomotor Activity:  Decreased  Concentration:  Concentration: Fair and Attention Span: Fair  Recall:  [employer] of Knowledge:  Fair  Language:  Fair  Akathisia:  Negative  Handed:  Right  AIMS (if indicated):     Assets:  Communication Skills  ADL's:  Intact  Cognition:  WNL  Sleep:  Number of Hours: 3.75     Treatment Plan Summary: Daily contact with patient to assess and evaluate symptoms and progress in treatment and Medication management  Continue reality based therapy he may not recall the previously described volatile behaviors but he will obviously need long-acting injectable so he is going to receive the paliperidone no change in precautions continue to engage in therapy but encourage compliance so we will not have to force further meds  Makayle Krahn, MD 02/20/2019, 10:00 AM

## 2019-02-20 NOTE — Progress Notes (Signed)
Recreation Therapy Notes  Date: 10.8.20 Time: 1000 Location: 400 Hall Dayroom   Group Topic: Leisure Education  Goal Area(s) Addresses:  Patient will identify positive leisure activities.  Patient will identify one positive benefit of participation in leisure activities.   Intervention: Leisure Building control surveyor, Music  Activity: [place].  Patients and LRT were arranged in a circle.  Participants tossed the ball to each other until the music stopped.  Whoever was holding the ball when the music stopped, was out.  The game continued until there was a winner.    Education:  Leisure Education, Dentist  Education Outcome: Acknowledges education/In group clarification offered/Needs additional education  Clinical Observations/Feedback: Patient did not attend group.    Victorino Sparrow, LRT/CTRS         Ria Comment, Kaushik Maul A 02/20/2019 11:20 AM

## 2019-02-20 NOTE — Progress Notes (Signed)
D: Pt denies SI/HI/AVH. Pt is pleasant and cooperative. Pt stated he wanted to get D/C. Pt has been compliant with medications and was educated on avg length of stay is 3-7 days. Pt still paranoid, but appeared more pleasant this evening.   A: Pt was offered support and encouragement. Pt was given scheduled medications. Pt was encourage to attend groups. Q 15 minute checks were done for safety.  R: safety maintained on unit.

## 2019-02-21 DIAGNOSIS — F250 Schizoaffective disorder, bipolar type: Secondary | ICD-10-CM | POA: Diagnosis not present

## 2019-02-21 MED ORDER — RISPERIDONE 3 MG PO TABS
6.0000 mg | ORAL_TABLET | Freq: Every day | ORAL | Status: DC
  Administered 2019-02-22 – 2019-03-06 (×13): 6 mg via ORAL
  Filled 2019-02-21 (×15): qty 2

## 2019-02-21 MED ORDER — DIVALPROEX SODIUM 250 MG PO DR TAB
250.0000 mg | DELAYED_RELEASE_TABLET | Freq: Every day | ORAL | Status: DC
  Administered 2019-02-22 – 2019-03-07 (×13): 250 mg via ORAL
  Filled 2019-02-21 (×16): qty 1

## 2019-02-21 MED ORDER — DIVALPROEX SODIUM 250 MG PO DR TAB
250.0000 mg | DELAYED_RELEASE_TABLET | Freq: Every day | ORAL | Status: DC
  Administered 2019-02-21: 21:00:00 250 mg via ORAL
  Filled 2019-02-21 (×2): qty 1

## 2019-02-21 NOTE — Progress Notes (Signed)
Patient ID: Zachary Eaton, male   DOB: 02/14/1986, 33 y.o.   MRN: [id number]  CSW was contacted back by pt's [facility] social worker, Zachary Eaton. Pt's social worker discussed the acute and chronic units at the [facility] in [place]. Pt's social worker stated that since the patient is IVCed he can get the transport straight there because of the issues he is having at home. Pt's social worker explained that the patient can start in the acute unit and then if needed can transfer to the chronic unit which is actually the long term unit. Pt's social worker encouraged CSW to go ahead and put him on the list and talk to the patient transport coordinator.  CSW contacted the patient transport coordinator, Zachary Eaton, and left a voicemail asking for the patient to be put on the list and explaining that the last call was confusing with the CSW due to not understanding the units.   CSW explained in an email to weekend social workers about the need to call over the weekend and provided the weekend patient transportation coordinator (who helps get the patient to the [facility] units if he is under IVC, which he is). Number is: [phone] ext. [phone].

## 2019-02-21 NOTE — Progress Notes (Signed)
Patient ID: Zachary Eaton, male   DOB: [birth year], 33 y.o.   MRN: [id number]   CSW received a call from [place] at the [facility] in [place] who is the Patient Multimedia programmer. Juliann Pulse asked if the patient is in need of inpatient at the [facility] for the patient because beds became available. CSW discussed the discharge plan of the patient.  CSW attempted to contact the patient's social worker through the [facility] in [place] at [phone] ext. [phone]. CSW left a voicemail.

## 2019-02-21 NOTE — Plan of Care (Signed)
Had an extensive discussion with Kriste Basque - she is his Education officer, museum at the [facility] in [place].    Her phone number is [phone] extension [phone].    She recommended the possibility of some type of supervised living situation such as a group home as the patient is been so volatile at home.  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   We will pass this on to our social work.  She is off on Monday but is available today and as soon as Tuesday after that

## 2019-02-21 NOTE — Progress Notes (Signed)
D: Pt denies SI/HI/AVH. Pt is pleasant and cooperative. Pt a little hyper active on the unit , pt was noted to have slept over 5 hrs during the day. Pt stated he felt energized and could not turn his mind off. Pt was given PRN Zyprexa per [facility] A: Pt was offered support and encouragement. Pt was given scheduled medications. Pt was encourage to attend groups. Q 15 minute checks were done for safety.  R:Pt attends groups and interacts well with peers and staff. Pt is taking medication. Pt has no complaints.Pt receptive to treatment and safety maintained on unit.

## 2019-02-21 NOTE — Progress Notes (Signed)
Recreation Therapy Notes  Date: 10.9.20 Time: 1000 Location: 400 Hall Dayroom  Group Topic: Goal Setting  Goal Area(s) Addresses:  Patient will be able to identify at least 3 life goals.  Patient will be able to identify benefit of investing in life goals.  Patient will be able to identify benefit of setting life goals.   Intervention:  Worksheet  Activity: Lobbyist.  Patients were to identify what they wanted to accomplish in a week, month, year and 5 years.  Patients would then identify any obstacles they may face, what they need to achieve goals and what they can start doing now to work towards goals.  Education:  Discharge Planning, Radiographer, therapeutic, Leisure Education   Education Outcome: Acknowledges Education/In Group Clarification Provided/Needs Additional Education  Clinical Observations:  Pt did not attend group.    Victorino Sparrow, LRT/CTRS         Victorino Sparrow A 02/21/2019 11:33 AM
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
## 2019-02-22 DIAGNOSIS — F29 Unspecified psychosis not due to a substance or known physiological condition: Secondary | ICD-10-CM

## 2019-02-22 DIAGNOSIS — F319 Bipolar disorder, unspecified: Principal | ICD-10-CM

## 2019-02-22 MED ORDER — DIVALPROEX SODIUM 500 MG PO DR TAB
500.0000 mg | DELAYED_RELEASE_TABLET | Freq: Every day | ORAL | Status: DC
  Administered 2019-02-22 – 2019-03-06 (×13): 500 mg via ORAL
  Filled 2019-02-22 (×15): qty 1

## 2019-02-22 MED ORDER — LITHIUM CARBONATE 150 MG PO CAPS
450.0000 mg | ORAL_CAPSULE | Freq: Every day | ORAL | Status: DC
  Administered 2019-02-22 – 2019-03-06 (×13): 450 mg via ORAL
  Filled 2019-02-22 (×15): qty 3

## 2019-02-22 NOTE — Progress Notes (Signed)
Patient ID: Zachary Eaton, male   DOB: 1986/03/22, 33 y.o.   MRN: [id number]   NOVEL CORONAVIRUS (COVID-19) DAILY CHECK-OFF SYMPTOMS - answer yes or no to each - every day NO YES  Have you had a fever in the past 24 hours?  . Fever (Temp > 37.80C / 100F) X   Have you had any of these symptoms in the past 24 hours? . New Cough .  Sore Throat  .  Shortness of Breath .  Difficulty Breathing .  Unexplained Body Aches   X   Have you had any one of these symptoms in the past 24 hours not related to allergies?   . Runny Nose .  Nasal Congestion .  Sneezing   X   If you have had runny nose, nasal congestion, sneezing in the past 24 hours, has it worsened?  X   EXPOSURES - check yes or no X   Have you traveled outside the state in the past 14 days?  X   Have you been in contact with someone with a confirmed diagnosis of COVID-19 or PUI in the past 14 days without wearing appropriate PPE?  X   Have you been living in the same home as a person with confirmed diagnosis of COVID-19 or a PUI (household contact)?    X   Have you been diagnosed with COVID-19?    X              What to do next: Answered NO to all: Answered YES to anything:   Proceed with unit schedule Follow the [facility] Inpatient Flowsheet.

## 2019-02-22 NOTE — Progress Notes (Signed)
Adult Psychoeducational Group Note  Date:  02/22/2019 Time:  9:22 PM  Group Topic/Focus:  Wrap-Up Group:   The focus of this group is to help patients review their daily goal of treatment and discuss progress on daily workbooks.  Participation Level:  Active  Participation Quality:  Appropriate  Affect:  Appropriate  Cognitive:  Appropriate  Insight: Appropriate  Engagement in Group:  Developing/Improving  Modes of Intervention:  Discussion  Additional Comments:  Pt stated his goal for today was to focus on his treatment plan. Pt stated he felt he accomplished his goal today. Pt stated his relationship with his family has improved since he was admitted here. Pt stated contacting his mother today help improve his day. Pt stated he felt better about himself today. Pt rated his overall day a 7 out of 10. Pt stated his appetite was pretty good today. Pt stated his goal for tonight is to get some rest. Pt did not complain of pain tonight.  Pt stated he was not hearing or seeing anything that was not there. Pt stated he had no thoughts of harming himself or others. Pt stated he would alert staff if anything changes.  Candy Sledge 02/22/2019, 9:22 PM

## 2019-02-22 NOTE — Progress Notes (Signed)
Adult Psychoeducational Group Note  Date:  02/22/2019 Time:  3:21 AM  Group Topic/Focus:  Wrap-Up Group:   The focus of this group is to help patients review their daily goal of treatment and discuss progress on daily workbooks.  Participation Level:  Active  Participation Quality:  Appropriate  Affect:  Appropriate  Cognitive:  Appropriate  Insight: Appropriate  Engagement in Group:  Developing/Improving  Modes of Intervention:  Discussion  Additional Comments:  Pt stated his goal for today was to focus on her treatment plan. Pt stated he felt he accomplished his goal today. Pt stated his relationship with his family has improved since he was admitted here. Pt stated been able to contact his mother today help improve his day. Pt stated he felt better about himself today. Pt rated his overall day a 5 out of 10. Pt stated his appetite had improve today. Pt stated his goal for tonight is to get some rest. Pt did complain of pain tonight in his back. Pt's nurse was made aware of the situation.  Pt stated he was not hearing or seeing anything that was not there. Pt stated he had no thoughts of harming himself or others. Pt stated he would alert staff if anything changes.  Candy Sledge 02/22/2019, 3:21 AM

## 2019-02-22 NOTE — Plan of Care (Signed)
  Problem: Coping: Goal: Coping ability will improve Outcome: Progressing Goal: Will verbalize feelings Outcome: Progressing   

## 2019-02-22 NOTE — [facility] Group Notes (Signed)
  [facility] LCSW Group Therapy Note  Date/Time:  02/22/2019 11:15AM-12:00PM  Type of Therapy and Topic:  Group Therapy:  Feelings About Hospitalization  Participation Level:  Active   Description of Group This process group involved patients discussing their feelings related to being hospitalized, as well as the benefits they see to being in the hospital.  These feelings and benefits were itemized.  The group then brainstormed specific ways in which they could seek those same benefits when they discharge and return home.  Therapeutic Goals 1. Patient will identify and describe positive and negative feelings related to hospitalization 2. Patient will verbalize benefits of hospitalization to themselves personally 3. 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  However, he also said that because this is his second time here, he is more accepting since he knew what to expect.  It is less stressful for him and he feels he is greeting the experience "with open arms."  He did say that some of his medicine makes him more anxious, and when that happens he wants to "bust through the window or run out the door."  Therapeutic Modalities Cognitive Behavioral Therapy Motivational Interviewing    Selmer Dominion, LCSW 02/22/2019, 1:42 PM

## 2019-02-22 NOTE — Progress Notes (Signed)
[facility] MD Progress Note  02/22/2019 9:25 AM Zachary Eaton  MRN:  [id number] Subjective: Patient is a 33 year old male with a past psychiatric and medical history of multiple traumatic brain injuries, PTSD, schizoaffective disorder.  He was admitted on 02/18/2019 secondary to worsening hallucinations, delusions and violent behaviors.  Objective: Patient is seen and examined.  Patient is a 33 year old male with the above-stated past psychiatric history who is seen in follow-up.  He remains significantly paranoid and with paranoid delusions about his family.  He discussed for several minutes the fact that he does not "want to tell anyone what is really happened in the home, this would ruin them".  He discusses the fact that when he gets out of the hospital he is going to "clear up some things" and then go back with the [employer] and go to [place].  He plans on never coming back.  He stated that he had had a previous doctor who gave him Vicodin and Ambien and these were the only medicines that helped.  He denied auditory or visual hallucinations.  He denied suicidal ideation.  Patient is familiar to me from an earlier note for forced medications because of his violent behaviors.  He continues on Depakote, Haldol as needed, lithium carbonate, and he received the long-acting paliperidone injection on 02/18/2019.  He also is receiving Risperdal 6 mg p.o. nightly as well as Restoril 30 mg p.o. nightly.  Review of his laboratories on admission with regards to his CBC and electrolytes were all essentially normal.  Blood alcohol and urine drug screen on admission were negative.  His vital signs are stable, he is afebrile.  Nursing notes reflect that he did not sleep last night.  He was in the nursing notes that he slept 5 hours during the day last night.  Principal Problem: <principal problem not specified> Diagnosis: Active Problems:   Psychosis ([place])  Total Time spent with patient: 20 minutes  Past Psychiatric  History: See admission H&P  Past Medical History:  Past Medical History:  Diagnosis Date  . Migraines   . Traumatic brain injury [facility])     Past Surgical History:  Procedure Laterality Date  . ANKLE SURGERY Left    Family History: History reviewed. No pertinent family history. Family Psychiatric  History: See admission H&P Social History:  Social History   Substance and Sexual Activity  Alcohol Use Yes   Comment: occ     Social History   Substance and Sexual Activity  Drug Use Not Currently    Social History   Socioeconomic History  . Marital status: Single    Spouse name: Not on file  . Number of children: Not on file  . Years of education: Not on file  . Highest education level: Not on file  Occupational History  . Not on file  Social Needs  . Financial resource strain: Not on file  . Food insecurity    Worry: Not on file    Inability: Not on file  . Transportation needs    Medical: Not on file    Non-medical: Not on file  Tobacco Use  . Smoking status: Current Some Day Smoker    Packs/day: 2.00    Types: Cigarettes  . Smokeless tobacco: Current User    Types: Chew  . Tobacco comment: patient declined  Substance and Sexual Activity  . Alcohol use: Yes    Comment: occ  . Drug use: Not Currently  . Sexual activity: Not Currently  Lifestyle  .  Physical activity    Days per week: Not on file    Minutes per session: Not on file  . Stress: Not on file  Relationships  . Social Musicianconnections    Talks on phone: Not on file    Gets together: Not on file    Attends religious service: Not on file    Active member of club or organization: Not on file    Attends meetings of clubs or organizations: Not on file    Relationship status: Not on file  Other Topics Concern  . Not on file  Social History Narrative  . Not on file   Additional Social History:                         Sleep: Poor  Appetite:  Fair  Current Medications: Current  Facility-Administered Medications  Medication Dose Route Frequency Provider Last Rate Last Dose  . acetaminophen (TYLENOL) tablet 650 mg  650 mg Oral Q6H PRN Denzil Magnusonhomas, Lashunda, NP   650 mg at 02/22/19 0155  . alum & mag hydroxide-simeth (MAALOX/MYLANTA) 200-200-20 MG/5ML suspension 30 mL  30 mL Oral Q4H PRN Denzil Magnusonhomas, Lashunda, NP      . divalproex (DEPAKOTE) DR tablet 250 mg  250 mg Oral Daily Malvin JohnsFarah, Brian, MD   250 mg at 02/22/19 0801  . divalproex (DEPAKOTE) DR tablet 250 mg  250 mg Oral QHS Malvin JohnsFarah, Brian, MD   250 mg at 02/21/19 2123  . feeding supplement (ENSURE ENLIVE) (ENSURE ENLIVE) liquid 237 mL  237 mL Oral BID BM Malvin JohnsFarah, Brian, MD   237 mL at 02/19/19 1630  . haloperidol lactate (HALDOL) injection 10 mg  10 mg Intramuscular BID PRN Malvin JohnsFarah, Brian, MD   10 mg at 02/19/19 1628  . lithium carbonate capsule 300 mg  300 mg Oral QHS Malvin JohnsFarah, Brian, MD   300 mg at 02/21/19 2123  . OLANZapine zydis (ZYPREXA) disintegrating tablet 10 mg  10 mg Oral Q8H PRN Antonieta Pertlary, Maylene Crocker Lawson, MD   10 mg at 02/21/19 2344   And  . LORazepam (ATIVAN) tablet 1 mg  1 mg Oral PRN Antonieta Pertlary, Victoria Euceda Lawson, MD      . magnesium hydroxide (MILK OF MAGNESIA) suspension 30 mL  30 mL Oral Daily PRN Denzil Magnusonhomas, Lashunda, NP      . nicotine polacrilex (NICORETTE) gum 2 mg  2 mg Oral PRN Jearld Leschixon, Rashaun M, NP   2 mg at 02/22/19 0802  . omega-3 acid ethyl esters (LOVAZA) capsule 1 g  1 g Oral BID Denzil Magnusonhomas, Lashunda, NP   1 g at 02/22/19 0801  . paliperidone (INVEGA SUSTENNA) injection 234 mg  234 mg Intramuscular Once Malvin JohnsFarah, Brian, MD      . risperiDONE (RISPERDAL) tablet 6 mg  6 mg Oral QHS Malvin JohnsFarah, Brian, MD      . temazepam (RESTORIL) capsule 30 mg  30 mg Oral QHS Malvin JohnsFarah, Brian, MD   30 mg at 02/21/19 2124    Lab Results: No results found for this or any previous visit (from the past 48 hour(s)).  Blood Alcohol level:  Lab Results  Component Value Date   [facility]   ETH <10 06/11/2018    Metabolic Disorder Labs: Lab Results   Component Value Date   HGBA1C 4.9 02/18/2019   MPG 93.93 02/18/2019   Lab Results  Component Value Date   PROLACTIN 27.8 (H) 02/18/2019   Lab Results  Component Value Date   CHOL 138 02/18/2019  TRIG 71 02/18/2019   HDL 44 02/18/2019   CHOLHDL 3.1 02/18/2019   VLDL 14 02/18/2019   LDLCALC 80 02/18/2019    Physical Findings: AIMS: Facial and Oral Movements Muscles of Facial Expression: None, normal Lips and Perioral Area: None, normal Jaw: None, normal Tongue: None, normal,Extremity Movements Upper (arms, wrists, hands, fingers): None, normal Lower (legs, knees, ankles, toes): None, normal, Trunk Movements Neck, shoulders, hips: None, normal, Overall Severity Severity of abnormal movements (highest score from questions above): None, normal Incapacitation due to abnormal movements: None, normal Patient's awareness of abnormal movements (rate only patient's report): No Awareness, Dental Status Current problems with teeth and/or dentures?: No Does patient usually wear dentures?: No  CIWA:  CIWA-Ar Total: 1 COWS:  COWS Total Score: 1  Musculoskeletal: Strength & Muscle Tone: within normal limits Gait & Station: normal Patient leans: N/A  Psychiatric Specialty Exam: Physical Exam  Nursing note and vitals reviewed. Constitutional: He appears well-developed and well-nourished.  HENT:  Head: Normocephalic and atraumatic.  Respiratory: Effort normal.  Neurological: He is alert.    ROS  Blood pressure 124/76, pulse 72, temperature 98.7 F (37.1 C), temperature source Oral, resp. rate 16, height 5\' 11"  (1.803 m), weight 79.4 kg.Body mass index is 24.41 kg/m.  General Appearance: Disheveled  Eye Contact:  Fair  Speech:  Normal Rate  Volume:  Normal  Mood:  Dysphoric and Irritable  Affect:  Congruent  Thought Process:  Goal Directed and Descriptions of Associations: Loose  Orientation:  Full (Time, Place, and Person)  Thought Content:  Delusions, Ideas of Reference:    Paranoia, Paranoid Ideation and Rumination  Suicidal Thoughts:  No  Homicidal Thoughts:  No  Memory:  Immediate;   Fair Recent;   Fair Remote;   Fair  Judgement:  Impaired  Insight:  Lacking  Psychomotor Activity:  Increased  Concentration:  Concentration: Fair and Attention Span: Fair  Recall:  of Knowledge:  Fair  Language:  Good  Akathisia:  Negative  Handed:  Right  AIMS (if indicated):     Assets:  Desire for Improvement Resilience  ADL's:  Intact  Cognition:  WNL  Sleep:  Number of Hours: 0     Treatment Plan Summary: Daily contact with patient to assess and evaluate symptoms and progress in treatment, Medication management and Plan : Patient is seen and examined.  Patient is a 33 year old male with the above-stated past psychiatric history who is seen in follow-up.   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  Patient remains significantly paranoid and delusional.  He focuses purely on being discharged from the hospital.  He is upset about being forced medications and believes that is illegal.  He has continuing delusions as well about going back in the 40 as well as "what ever my parents did to me".  He is on Depakote, lithium, Risperdal and temazepam at bedtime.  I am going to increase his Depakote dosage at bedtime to 500 mg, and also increase his lithium dosage to 450 mg in attempt to control his mood and stabilize things.  He was to receive the long-acting paliperidone injection on 10/6, but at least the note says that it was not given.  He does have forced medication protocol in place.  I will have to clarify whether or not that was given.  Otherwise no other changes in his medications at this  point. 1.  Increase Depakote DR to 250 mg p.o. daily and 500 mg p.o. nightly for mood stability. 2.  Increase  lithium carbonate capsule of 450 mg p.o. nightly for mood stability. 3.  Continue omega-3 fatty acids 1 g p.o. daily for neuro protection. 4.  Clarify whether patient received the paliperidone long-acting injection. 5.  Continue Risperdal 6 mg p.o. nightly for psychosis and mood stability. 6.  Continue Restoril 30 mg p.o. nightly for insomnia. 7.  Disposition planning-in progress.  Antonieta Pert, MD 02/22/2019, 9:25 AM

## 2019-02-23 NOTE — [facility] Group Notes (Signed)
[facility] Group Notes:  (Nursing/MHT/Case Management/Adjunct)  Date:  02/23/2019  Time:  0900 am  Type of Therapy:  Nurse Education  Participation Level:  Did Not Attend   Zachary Eaton 

## 2019-02-23 NOTE — Progress Notes (Signed)
Patient has been up in the dayroom watching tv with peers. He reports having had a food day and attempting to take a nap but his mind would not let him rest. He reports being ready to get out of the hospital. He was compliant with his medications. Safety maintained with 15 min checks.

## 2019-02-23 NOTE — B[facility] Counselor (Signed)
Clinical Social Work Note  CSW spoke on 02/22/2019 with the transfer staff at [facility] about whether there is a bed available for him yet.  They stated they needed a transfer packet before he could be considered, including a variety of clinical information.  CSW obtained doctor's signature this AM and faxed that transfer packet to the [facility] including:  Physician Certification for Transfer  Transfer [place]  [facility] [place] Assessment  Psychiatric Admission Assessment  MD Progress Note from 02/20/2019  MD Progress Note from 02/21/2019  MD Progress Note from 02/22/2019  Medication History  Vitals and Vital History  [address] Appearances Query   Selmer Dominion, LCSW 02/23/2019, 9:02 AM

## 2019-02-23 NOTE — [facility] Group Notes (Signed)
[place] LCSW Group Therapy Note  Date/Time:  02/23/2019  11:00AM-12:00PM  Type of Therapy and Topic:  Group Therapy:  Music and Mood  Participation Level:  Active   Description of Group: In this process group, members listened to a variety of genres of music and identified that different types of music evoke different responses.  Patients were encouraged to identify music that was soothing for them and music that was energizing for them.  Patients discussed how this knowledge can help with wellness and recovery in various ways including managing depression and anxiety as well as encouraging healthy sleep habits.    Therapeutic Goals: 1. Patients will explore the impact of different varieties of music on mood 2. Patients will verbalize the thoughts they have when listening to different types of music 3. Patients will identify music that is soothing to them as well as music that is energizing to them 4. Patients will discuss how to use this knowledge to assist in maintaining wellness and recovery 5. Patients will explore the use of music as a coping skill  Summary of Patient Progress:  At the beginning of group, patient expressed that he felt sleepy and anxious to leave, rating the anxiety a 4 out of 10. He requested a number of songs.  At the end of group, patient expressed that he felt less sleepy and had no anxiety left at all.    Therapeutic Modalities: Solution Focused Brief Therapy Activity   Selmer Dominion, LCSW

## 2019-02-23 NOTE — Progress Notes (Signed)
Patient observed up in the dayroom watching tv. Writer spoke with him 1:1 and he reports having had a good day. He reports that his problems did not start until he stopped dipping tobacco cold [place]. He was compliant with his medications. Safety maintained with 15 min checks.

## 2019-02-23 NOTE — BHH Counselor (Signed)
Clinical Social Work Note  CSW called the [facility] in [place] to inquire about status of transfer request.  They have had no discharges and "limited beds" so do not anticipate the transfer request packet to be even examined prior to some discharges on Tuesday 10/14.  Selmer Dominion, LCSW 02/23/2019, 1:40 PM

## 2019-02-23 NOTE — Progress Notes (Signed)
Adult Psychoeducational Group Note  Date:  02/23/2019 Time:  11:06 PM  Group Topic/Focus:  Wrap-Up Group:   The focus of this group is to help patients review their daily goal of treatment and discuss progress on daily workbooks.  Participation Level:  Active  Participation Quality:  Appropriate  Affect:  Appropriate  Cognitive:  Appropriate  Insight: Appropriate  Engagement in Group:  Developing/Improving  Modes of Intervention:  Discussion  Additional Comments:  Pt stated his goal for today was to focus on his treatment plan. Pt stated he felt he accomplished his goal today. Pt stated his relationship with his family has improved since he was admitted here.  Pt stated he was able to speak with his parents today. Pt stated he felt better about himself today. Pt rated his overall day a 7 out of 10. Pt stated his appetite was pretty good today. Pt stated his goal for tonight is to get some rest. Pt did not complain of pain tonight.  Pt stated he was not hearing or seeing anything that was not there. Pt stated he had no thoughts of harming himself or others. Pt stated he would alert staff if anything changes.  Candy Sledge 02/23/2019, 11:06 PM

## 2019-02-23 NOTE — Progress Notes (Signed)
D. Pt is calm and cooperative, friendly during interactions- has been visible in the milieu interacting appropriately with peers.  Pt currently denies SI/HI and AVH A. Labs and vitals monitored. Pt compliant with medications. Pt supported emotionally and encouraged to express concerns and ask questions.   R. Pt remains safe with 15 minute checks. Will continue POC.

## 2019-02-23 NOTE — Progress Notes (Signed)
[place] NOVEL CORONAVIRUS (COVID-19) DAILY CHECK-OFF SYMPTOMS - answer yes or no to each - every day NO YES  Have you had a fever in the past 24 hours?  . Fever (Temp > 37.80C / 100F) X   Have you had any of these symptoms in the past 24 hours? . New Cough .  Sore Throat  .  Shortness of Breath .  Difficulty Breathing .  Unexplained Body Aches   X   Have you had any one of these symptoms in the past 24 hours not related to allergies?   . Runny Nose .  Nasal Congestion .  Sneezing   X   If you have had runny nose, nasal congestion, sneezing in the past 24 hours, has it worsened?  X   EXPOSURES - check yes or no X   Have you traveled outside the state in the past 14 days?  X   Have you been in contact with someone with a confirmed diagnosis of COVID-19 or PUI in the past 14 days without wearing appropriate PPE?  X   Have you been living in the same home as a person with confirmed diagnosis of COVID-19 or a PUI (household contact)?    X   Have you been diagnosed with COVID-19?    X              What to do next: Answered NO to all: Answered YES to anything:   Proceed with unit schedule Follow the [facility] Inpatient Flowsheet.   

## 2019-02-23 NOTE — Progress Notes (Signed)
[facility]  02/23/2019 10:37 AM Zachary LocketJoshua D Eaton  MRN:  [id number] Subjective:  Patient is a 33 year old male with a past psychiatric and medical history of multiple traumatic brain injuries, PTSD, schizoaffective disorder.  He was admitted on 02/18/2019 secondary to worsening hallucinations, delusions and violent behaviors.  Objective: Patient is seen and examined.  Patient is 33 year old male with the above-stated past psychiatric history who is seen in follow-up.  The patient's Depakote and lithium carbonate were both increased yesterday.  It appears that he had a fairly decent day yesterday.  Social work is attempting on getting him transferred [facility].  On examination today he is doing much better.  Much less threatening, much less angry, much less irritable.  He is actually able to smile and engage this morning.  His vital signs are stable, he is afebrile.  He only slept 3.5 hours last night, but he was sleeping later this morning.  No new laboratories.  He denied any auditory or visual hallucinations.  He denied any suicidal or homicidal ideation.  Principal Problem: <principal problem not specified> Diagnosis: Active Problems:   Psychosis (HCC)  Total Time spent with patient: 20 minutes  Past Psychiatric History: See admission H&P  Past Medical History:  Past Medical History:  Diagnosis Date  . Migraines   . Traumatic brain injury [facility](HCC)     Past Surgical History:  Procedure Laterality Date  . ANKLE SURGERY Left    Family History: History reviewed. No pertinent family history. Family Psychiatric  History: See admission H&P Social History:  Social History   Substance and Sexual Activity  Alcohol Use Yes   Comment: occ     Social History   Substance and Sexual Activity  Drug Use Not Currently    Social History   Socioeconomic History  . Marital status: Single    Spouse name: Not on file  . Number of children: Not on file  . Years of education:  Not on file  . Highest education level: Not on file  Occupational History  . Not on file  Social Needs  . Financial resource strain: Not on file  . Food insecurity    Worry: Not on file    Inability: Not on file  . Transportation needs    Medical: Not on file    Non-medical: Not on file  Tobacco Use  . Smoking status: Current Some Day Smoker    Packs/day: 2.00    Types: Cigarettes  . Smokeless tobacco: Current User    Types: Chew  . Tobacco comment: patient declined  Substance and Sexual Activity  . Alcohol use: Yes    Comment: occ  . Drug use: Not Currently  . Sexual activity: Not Currently  Lifestyle  . Physical activity    Days per week: Not on file    Minutes per session: Not on file  . Stress: Not on file  Relationships  . Social Musicianconnections    Talks on phone: Not on file    Gets together: Not on file    Attends religious service: Not on file    Active member of club or organization: Not on file    Attends meetings of clubs or organizations: Not on file    Relationship status: Not on file  Other Topics Concern  . Not on file  Social History Narrative  . Not on file   Additional Social History:  Sleep: Fair  Appetite:  Fair  Current Medications: Current Facility-Administered Medications  Medication Dose Route Frequency Provider Last Rate Last Dose  . acetaminophen (TYLENOL) tablet 650 mg  650 mg Oral Q6H PRN [name], NP   650 mg at 02/22/19 0155  . alum & mag hydroxide-simeth (MAALOX/MYLANTA) 200-200-20 MG/5ML suspension 30 mL  30 mL Oral Q4H PRN [name], NP      . divalproex (DEPAKOTE) DR tablet 250 mg  250 mg Oral Daily [name], MD   250 mg at 02/23/19 0839  . divalproex (DEPAKOTE) DR tablet 500 mg  500 mg Oral QHS [name], MD   500 mg at 02/22/19 2053  . feeding supplement (ENSURE ENLIVE) (ENSURE ENLIVE) liquid 237 mL  237 mL Oral BID BM [name], MD   237 mL at 02/22/19 1512  .  haloperidol lactate (HALDOL) injection 10 mg  10 mg Intramuscular BID PRN [name], MD   10 mg at 02/19/19 1628  . lithium carbonate capsule 450 mg  450 mg Oral QHS [name], MD   450 mg at 02/22/19 2052  . OLANZapine zydis (ZYPREXA) disintegrating tablet 10 mg  10 mg Oral Q8H PRN [name], MD   10 mg at 02/21/19 2344   And  . LORazepam (ATIVAN) tablet 1 mg  1 mg Oral PRN [name], MD      . magnesium hydroxide (MILK OF MAGNESIA) suspension 30 mL  30 mL Oral Daily PRN [name], NP      . nicotine polacrilex (NICORETTE) gum 2 mg  2 mg Oral PRN [name], NP   2 mg at 02/22/19 0802  . omega-3 acid ethyl esters (LOVAZA) capsule 1 g  1 g Oral BID [name], NP   1 g at 02/23/19 0839  . paliperidone (INVEGA SUSTENNA) injection 234 mg  234 mg Intramuscular Once [name], MD      . risperiDONE (RISPERDAL) tablet 6 mg  6 mg Oral QHS [name], MD   6 mg at 02/22/19 2053  . temazepam (RESTORIL) capsule 30 mg  30 mg Oral QHS [name], MD   30 mg at 02/22/19 2052    Lab Results: No results found for this or any previous visit (from the past 48 hour(s)).  Blood Alcohol level:  Lab Results  Component Value Date   ETH <10 02/17/2019   ETH <10 09/38/1829    Metabolic Disorder Labs: Lab Results  Component Value Date   HGBA1C 4.9 02/18/2019   MPG 93.93 02/18/2019   Lab Results  Component Value Date   PROLACTIN 27.8 (H) 02/18/2019   Lab Results  Component Value Date   CHOL 138 02/18/2019   TRIG 71 02/18/2019   HDL 44 02/18/2019   CHOLHDL 3.1 02/18/2019   VLDL 14 02/18/2019   LDLCALC 80 02/18/2019    Physical Findings: AIMS: Facial and Oral Movements Muscles of Facial Expression: None, normal Lips and Perioral Area: None, normal Jaw: None, normal Tongue: None, normal,Extremity Movements Upper (arms, wrists, hands, fingers): None, normal Lower (legs, knees, ankles, toes): None, normal, Trunk Movements Neck, shoulders,  hips: None, normal, Overall Severity Severity of abnormal movements (highest score from questions above): None, normal Incapacitation due to abnormal movements: None, normal Patient's awareness of abnormal movements (rate only patient's report): No Awareness, Dental Status Current problems with teeth and/or dentures?: No Does patient usually wear dentures?: No  CIWA:  CIWA-Ar Total: 1 COWS:  COWS Total Score: 1  Musculoskeletal: Strength & Muscle Tone: within normal limits Gait & Station: normal Patient leans: N/A  Psychiatric Specialty Exam: Physical Exam  Nursing note and vitals reviewed. Constitutional: He appears well-developed and well-nourished.  HENT:  Head: Normocephalic and atraumatic.  Respiratory: Effort normal.  Neurological: He is alert.    ROS  Blood pressure 116/82, pulse 66, temperature 98.7 F (37.1 C), temperature source Oral, resp. rate 16, height 5\' 11"  (1.803 m), weight 79.4 kg.Body mass index is 24.41 kg/m.  General Appearance: Disheveled  Eye Contact:  Fair  Speech:  Normal Rate  Volume:  Decreased  Mood:  Mildly sedated  Affect:  Congruent  Thought Process:  Coherent and Descriptions of Associations: Circumstantial  Orientation:  Full (Time, Place, and Person)  Thought Content:  Logical  Suicidal Thoughts:  No  Homicidal Thoughts:  No  Memory:  Immediate;   Fair Recent;   Fair Remote;   Fair  Judgement:  Intact  Insight:  Fair  Psychomotor Activity:  Normal  Concentration:  Concentration: Fair and Attention Span: Fair  Recall:  of Knowledge:  Fair  Language:  Fair  Akathisia:  Negative  Handed:  Right  AIMS (if indicated):     Assets:  Desire for Improvement Resilience  ADL's:  Intact  Cognition:  WNL  Sleep:  Number of Hours: 3.5     Treatment Plan Summary: Daily contact with patient to assess and evaluate symptoms and progress in treatment, Medication management and Plan : Patient is seen and examined.  Patient is a  33 year old male with the above-stated past psychiatric history who is seen in follow-up.   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  He is much less agitated today, his paranoid and delusional thinking has decreased.  He was not demanding discharge, and less focused on what it occurred in his home.  His sleep is still not great, but I think with him sleeping a bit this morning that that will improve with his increase Depakote and lithium dosage.  No changes in his medications today, and hopefully he will continue to improve.  Social work is attempting to get him to the [facility] hospital. 1.  Continue Depakote DR to 250 mg p.o. daily and 500 mg p.o. nightly for mood stability. 2.  Continue lithium carbonate capsule of 450 mg p.o. nightly for mood stability. 3.  Continue omega-3 fatty acids 1 g p.o. daily for neuro protection. 4.  Clarify whether patient received the paliperidone long-acting injection. 5.  Continue Risperdal 6 mg p.o. nightly for psychosis and mood stability. 6.  Continue Restoril 30 mg p.o. nightly for insomnia. 7.  Disposition planning-in progress.  [facility], MD 02/23/2019, 10:37 AM

## 2019-02-24 DIAGNOSIS — F250 Schizoaffective disorder, bipolar type: Secondary | ICD-10-CM | POA: Diagnosis not present

## 2019-02-24 MED ORDER — PALIPERIDONE PALMITATE ER 156 MG/ML IM SUSY
156.0000 mg | PREFILLED_SYRINGE | Freq: Once | INTRAMUSCULAR | Status: AC
  Administered 2019-02-24: 12:00:00 156 mg via INTRAMUSCULAR
  Filled 2019-02-24: qty 1

## 2019-02-24 NOTE — Progress Notes (Signed)
Adult Psychoeducational Group Note  Date:  02/24/2019 Time:  10:44 PM  Group Topic/Focus:  Wrap-Up Group:   The focus of this group is to help patients review their daily goal of treatment and discuss progress on daily workbooks.  Participation Level:  Active  Participation Quality:  Appropriate  Affect:  Appropriate  Cognitive:  Appropriate  Insight: Appropriate  Engagement in Group:  Developing/Improving  Modes of Intervention:  Discussion  Additional Comments:  Pt stated his goal for today was to focus on his treatment plan. Pt stated he felt he accomplished his goal today.   Pt stated his relationship with his family has improved since he was admitted here. Pt stated been able to contact his parents today help improve his day.  Pt stated he felt better about himself today. Pt rated his overall day a 7 out of 10. Pt stated his appetite was pretty good today. Pt stated his goal for tonight was to get some rest. Pt did not complain of pain tonight. Pt stated he was not hearing or seeing anything that was not there.  Pt stated he had no thoughts of harming himself or others. Pt stated he would alert staff if anything changes.  Candy Sledge 02/24/2019, 10:44 PM

## 2019-02-24 NOTE — Plan of Care (Signed)
  Problem: Activity: Goal: Will verbalize the importance of balancing activity with adequate rest periods Outcome: Progressing   Problem: Coping: Goal: Coping ability will improve Outcome: Progressing Goal: Will verbalize feelings Outcome: Progressing   

## 2019-02-24 NOTE — Progress Notes (Signed)
[facility] MD Progress Note  02/24/2019 11:08 AM Zachary Eaton  MRN:  [id number] Subjective:    Patient focused on discharge even today however plans are certainly unrealistic hopes to simply rent a [employer] and hockey to his truck and find somewhere to stay again no formed plans.  He is contained in mood lacking insight denies hallucinations or thoughts of self-harm no involuntary movements reports he is compliant with meds would rather have the bulk of medications were all medications at bedtime. Still denies destruction of property at home  Principal Problem: Severity of psychotic illness Diagnosis: Active Problems:   Psychosis (HCC)  Total Time spent with patient: 20 minutes  Past Psychiatric History: Last here February see notes  Past Medical History:  Past Medical History:  Diagnosis Date  . Migraines   . Traumatic brain injury [facility])     Past Surgical History:  Procedure Laterality Date  . ANKLE SURGERY Left    Family History: History reviewed. No pertinent family history. Family Psychiatric  History: No new data Social History:  Social History   Substance and Sexual Activity  Alcohol Use Yes   Comment: occ     Social History   Substance and Sexual Activity  Drug Use Not Currently    Social History   Socioeconomic History  . Marital status: Single    Spouse name: Not on file  . Number of children: Not on file  . Years of education: Not on file  . Highest education level: Not on file  Occupational History  . Not on file  Social Needs  . Financial resource strain: Not on file  . Food insecurity    Worry: Not on file    Inability: Not on file  . Transportation needs    Medical: Not on file    Non-medical: Not on file  Tobacco Use  . Smoking status: Current Some Day Smoker    Packs/day: 2.00    Types: Cigarettes  . Smokeless tobacco: Current User    Types: Chew  . Tobacco comment: patient declined  Substance and Sexual Activity  . Alcohol use: Yes   Comment: occ  . Drug use: Not Currently  . Sexual activity: Not Currently  Lifestyle  . Physical activity    Days per week: Not on file    Minutes per session: Not on file  . Stress: Not on file  Relationships  . Social Musician on phone: Not on file    Gets together: Not on file    Attends religious service: Not on file    Active member of club or organization: Not on file    Attends meetings of clubs or organizations: Not on file    Relationship status: Not on file  Other Topics Concern  . Not on file  Social History Narrative  . Not on file   Additional Social History:                         Sleep: Good  Appetite:  Good  Current Medications: Current Facility-Administered Medications  Medication Dose Route Frequency Provider Last Rate Last Dose  . acetaminophen (TYLENOL) tablet 650 mg  650 mg Oral Q6H PRN Denzil Magnuson, NP   650 mg at 02/22/19 0155  . alum & mag hydroxide-simeth (MAALOX/MYLANTA) 200-200-20 MG/5ML suspension 30 mL  30 mL Oral Q4H PRN Denzil Magnuson, NP      . divalproex (DEPAKOTE) DR tablet 250 mg  250  mg Oral Daily [name], MD   250 mg at 02/24/19 2229  . divalproex (DEPAKOTE) DR tablet 500 mg  500 mg Oral QHS [name], MD   500 mg at 02/23/19 2055  . feeding supplement (ENSURE ENLIVE) (ENSURE ENLIVE) liquid 237 mL  237 mL Oral BID BM [name], MD   237 mL at 02/24/19 0924  . haloperidol lactate (HALDOL) injection 10 mg  10 mg Intramuscular BID PRN [name], MD   10 mg at 02/19/19 1628  . lithium carbonate capsule 450 mg  450 mg Oral QHS [name], MD   450 mg at 02/23/19 2055  . OLANZapine zydis (ZYPREXA) disintegrating tablet 10 mg  10 mg Oral Q8H PRN [name], MD   10 mg at 02/21/19 2344   And  . LORazepam (ATIVAN) tablet 1 mg  1 mg Oral PRN [name], MD      . magnesium hydroxide (MILK OF MAGNESIA) suspension 30 mL  30 mL Oral Daily PRN [name], NP      . nicotine  polacrilex (NICORETTE) gum 2 mg  2 mg Oral PRN [name], NP   2 mg at 02/24/19 0914  . omega-3 acid ethyl esters (LOVAZA) capsule 1 g  1 g Oral BID [name], NP   1 g at 02/24/19 0914  . paliperidone (INVEGA SUSTENNA) injection 234 mg  234 mg Intramuscular Once [name], MD      . risperiDONE (RISPERDAL) tablet 6 mg  6 mg Oral QHS [name], MD   6 mg at 02/23/19 2056  . temazepam (RESTORIL) capsule 30 mg  30 mg Oral QHS [name], MD   30 mg at 02/23/19 2055    Lab Results: No results found for this or any previous visit (from the past 57 hour(s)).  Blood Alcohol level:  Lab Results  Component Value Date   ETH <10 02/17/2019   ETH <10 79/89/2119    Metabolic Disorder Labs: Lab Results  Component Value Date   HGBA1C 4.9 02/18/2019   MPG 93.93 02/18/2019   Lab Results  Component Value Date   PROLACTIN 27.8 (H) 02/18/2019   Lab Results  Component Value Date   CHOL 138 02/18/2019   TRIG 71 02/18/2019   HDL 44 02/18/2019   CHOLHDL 3.1 02/18/2019   VLDL 14 02/18/2019   LDLCALC 80 02/18/2019    Physical Findings: AIMS: Facial and Oral Movements Muscles of Facial Expression: None, normal Lips and Perioral Area: None, normal Jaw: None, normal Tongue: None, normal,Extremity Movements Upper (arms, wrists, hands, fingers): None, normal Lower (legs, knees, ankles, toes): None, normal, Trunk Movements Neck, shoulders, hips: None, normal, Overall Severity Severity of abnormal movements (highest score from questions above): None, normal Incapacitation due to abnormal movements: None, normal Patient's awareness of abnormal movements (rate only patient's report): No Awareness, Dental Status Current problems with teeth and/or dentures?: No Does patient usually wear dentures?: No  CIWA:  CIWA-Ar Total: 1 COWS:  COWS Total Score: 1  Musculoskeletal: Strength & Muscle Tone: within normal limits Gait & Station: normal Patient leans: N/A  Psychiatric  Specialty Exam: Physical Exam  ROS  Blood pressure 122/79, pulse 82, temperature 97.9 F (36.6 C), temperature source Oral, resp. rate 16, height 5\' 11"  (1.803 m), weight 79.4 kg.Body mass index is 24.41 kg/m.  General Appearance: Casual  Eye Contact:  Fair  Speech:  Slow  Volume:  Decreased  Mood:  Dysphoric  Affect:  Blunt  Thought Process:  Irrelevant and Descriptions of Associations: Circumstantial  Orientation:  Full (Time, Place, and Person)  Thought Content:  Illogical  Suicidal Thoughts:  No  Homicidal Thoughts:  No  Memory:  Immediate;   Poor Recent;   Fair Remote;   Good  Judgement:  Fair  Insight: Not yet reality based  Psychomotor Activity:  Decreased  Concentration:  Concentration: Fair and Attention Span: Fair  Recall:  [employer]iservFair  Fund of Knowledge:  Fair  Language:  Fair  Akathisia:  Negative  Handed:  Right  AIMS (if indicated):     Assets:  Manufacturing systems engineerCommunication Skills Housing Leisure Time Physical Health Resilience Social Support  ADL's:  Intact  Cognition:  WNL  Sleep:  Number of Hours: 5.5     Treatment Plan Summary: Daily contact with patient to assess and evaluate symptoms and progress in treatment and Medication management  Continue reality based therapy not yet stable for discharge will [facility] by tomorrow as she is off today  Meds are reviewed no change in precautions continue reality based therapy  Angeles Paolucci, MD 02/24/2019, 11:08 AM

## 2019-02-24 NOTE — Progress Notes (Signed)
Patient ID: Zachary Eaton, male   DOB: 04/24/1986, 33 y.o.   MRN: [id number]  [place] NOVEL CORONAVIRUS (COVID-19) DAILY CHECK-OFF SYMPTOMS - answer yes or no to each - every day NO YES  Have you had a fever in the past 24 hours?  . Fever (Temp > 37.80C / 100F) X   Have you had any of these symptoms in the past 24 hours? . New Cough .  Sore Throat  .  Shortness of Breath .  Difficulty Breathing .  Unexplained Body Aches   X   Have you had any one of these symptoms in the past 24 hours not related to allergies?   . Runny Nose .  Nasal Congestion .  Sneezing   X   If you have had runny nose, nasal congestion, sneezing in the past 24 hours, has it worsened?  X   EXPOSURES - check yes or no X   Have you traveled outside the state in the past 14 days?  X   Have you been in contact with someone with a confirmed diagnosis of COVID-19 or PUI in the past 14 days without wearing appropriate PPE?  X   Have you been living in the same home as a person with confirmed diagnosis of COVID-19 or a PUI (household contact)?    X   Have you been diagnosed with COVID-19?    X              What to do next: Answered NO to all: Answered YES to anything:   Proceed with unit schedule Follow the [facility] Inpatient Flowsheet.   

## 2019-02-24 NOTE — Tx Team (Signed)
Interdisciplinary Treatment and Diagnostic Plan Update  02/24/2019 Time of Session: 0850am Zachary Eaton MRN: [id number]  Principal Diagnosis: <principal problem not specified>  Secondary Diagnoses: Active Problems:   Psychosis (HCC)   Current Medications:  Current Facility-Administered Medications  Medication Dose Route Frequency Provider Last Rate Last Dose  . acetaminophen (TYLENOL) tablet 650 mg  650 mg Oral Q6H PRN Denzil Magnuson, NP   650 mg at 02/22/19 0155  . alum & mag hydroxide-simeth (MAALOX/MYLANTA) 200-200-20 MG/5ML suspension 30 mL  30 mL Oral Q4H PRN Denzil Magnuson, NP      . divalproex (DEPAKOTE) DR tablet 250 mg  250 mg Oral Daily Malvin Johns, MD   250 mg at 02/24/19 0914  . divalproex (DEPAKOTE) DR tablet 500 mg  500 mg Oral QHS Antonieta Pert, MD   500 mg at 02/23/19 2055  . feeding supplement (ENSURE ENLIVE) (ENSURE ENLIVE) liquid 237 mL  237 mL Oral BID BM Malvin Johns, MD   237 mL at 02/24/19 0924  . haloperidol lactate (HALDOL) injection 10 mg  10 mg Intramuscular BID PRN Malvin Johns, MD   10 mg at 02/19/19 1628  . lithium carbonate capsule 450 mg  450 mg Oral QHS Antonieta Pert, MD   450 mg at 02/23/19 2055  . OLANZapine zydis (ZYPREXA) disintegrating tablet 10 mg  10 mg Oral Q8H PRN Antonieta Pert, MD   10 mg at 02/21/19 2344   And  . LORazepam (ATIVAN) tablet 1 mg  1 mg Oral PRN Antonieta Pert, MD      . magnesium hydroxide (MILK OF MAGNESIA) suspension 30 mL  30 mL Oral Daily PRN Denzil Magnuson, NP      . nicotine polacrilex (NICORETTE) gum 2 mg  2 mg Oral PRN Jearld Lesch, NP   2 mg at 02/24/19 1120  . omega-3 acid ethyl esters (LOVAZA) capsule 1 g  1 g Oral BID Denzil Magnuson, NP   1 g at 02/24/19 0914  . paliperidone (INVEGA SUSTENNA) injection 234 mg  234 mg Intramuscular Once Malvin Johns, MD      . risperiDONE (RISPERDAL) tablet 6 mg  6 mg Oral QHS Malvin Johns, MD   6 mg at 02/23/19 2056  . temazepam (RESTORIL)  capsule 30 mg  30 mg Oral QHS Malvin Johns, MD   30 mg at 02/23/19 2055   PTA Medications: Medications Prior to Admission  Medication Sig Dispense Refill Last Dose  . divalproex (DEPAKOTE SPRINKLE) 125 MG capsule Take 2 capsules (250 mg total) by mouth every 12 (twelve) hours. (Patient not taking: Reported on 02/18/2019) 60 capsule 1 Not Taking at Unknown time  . lithium carbonate 150 MG capsule Take 1 capsule (150 mg total) by mouth at bedtime. (Patient not taking: Reported on 02/18/2019) 60 capsule 1 Not Taking at Unknown time  . omega-3 acid ethyl esters (LOVAZA) 1 g capsule Take 1 capsule (1 g total) by mouth 2 (two) times daily. (Patient not taking: Reported on 02/18/2019) 60 capsule 1 Not Taking at Unknown time    Patient Stressors: Substance abuse Traumatic event  Patient Strengths: Ability for insight Communication skills General fund of knowledge Supportive family/friends  Treatment Modalities: Medication Management, Group therapy, Case management,  1 to 1 session with clinician, Psychoeducation, Recreational therapy.   Physician Treatment Plan for Primary Diagnosis: <principal problem not specified> Long Term Goal(s): Improvement in symptoms so as ready for discharge Improvement in symptoms so as ready for discharge   Short Term Goals:  Ability to identify changes in lifestyle to reduce recurrence of condition will improve Ability to verbalize feelings will improve Ability to disclose and discuss suicidal ideas Ability to demonstrate self-control will improve Ability to identify and develop effective coping behaviors will improve Ability to verbalize feelings will improve Ability to disclose and discuss suicidal ideas Ability to demonstrate self-control will improve Ability to identify and develop effective coping behaviors will improve  Medication Management: Evaluate patient's response, side effects, and tolerance of medication regimen.  Therapeutic Interventions: 1 to 1  sessions, Unit Group sessions and Medication administration.  Evaluation of Outcomes: Progressing  Physician Treatment Plan for Secondary Diagnosis: Active Problems:   Psychosis ([place])  Long Term Goal(s): Improvement in symptoms so as ready for discharge Improvement in symptoms so as ready for discharge   Short Term Goals: Ability to identify changes in lifestyle to reduce recurrence of condition will improve Ability to verbalize feelings will improve Ability to disclose and discuss suicidal ideas Ability to demonstrate self-control will improve Ability to identify and develop effective coping behaviors will improve Ability to verbalize feelings will improve Ability to disclose and discuss suicidal ideas Ability to demonstrate self-control will improve Ability to identify and develop effective coping behaviors will improve     Medication Management: Evaluate patient's response, side effects, and tolerance of medication regimen.  Therapeutic Interventions: 1 to 1 sessions, Unit Group sessions and Medication administration.  Evaluation of Outcomes: Progressing   RN Treatment Plan for Primary Diagnosis: <principal problem not specified> Long Term Goal(s): Knowledge of disease and therapeutic regimen to maintain health will improve  Short Term Goals: Ability to demonstrate self-control, Ability to identify and develop effective coping behaviors will improve and Compliance with prescribed medications will improve  Medication Management: RN will administer medications as ordered by provider, will assess and evaluate patient's response and provide education to patient for prescribed medication. RN will report any adverse and/or side effects to prescribing provider.  Therapeutic Interventions: 1 on 1 counseling sessions, Psychoeducation, Medication administration, Evaluate responses to treatment, Monitor vital signs and CBGs as ordered, Perform/monitor CIWA, COWS, AIMS and Fall Risk  screenings as ordered, Perform wound care treatments as ordered.  Evaluation of Outcomes: Progressing   LCSW Treatment Plan for Primary Diagnosis: <principal problem not specified> Long Term Goal(s): Safe transition to appropriate next level of care at discharge, Engage patient in therapeutic group addressing interpersonal concerns.  Short Term Goals: Engage patient in aftercare planning with referrals and resources, Increase social support, Increase emotional regulation, Identify triggers associated with mental health/substance abuse issues and Increase skills for wellness and recovery  Therapeutic Interventions: Assess for all discharge needs, 1 to 1 time with Social worker, Explore available resources and support systems, Assess for adequacy in community support network, Educate family and significant other(s) on suicide prevention, Complete Psychosocial Assessment, Interpersonal group therapy.  Evaluation of Outcomes: Progressing   Progress in Treatment: Attending groups: Yes. Participating in groups: Yes. Taking medication as prescribed: Yes. Toleration medication: Yes. Family/Significant other contact made: Yes, individual(s) contacted:  mother Patient understands diagnosis: No. Discussing patient identified problems/goals with staff: Yes. Medical problems stabilized or resolved: Yes. Denies suicidal/homicidal ideation: Yes. Issues/concerns per patient self-inventory: Yes.  New problem(s) identified: Yes, Describe:  Violence toward family.  New Short Term/Long Term Goal(s): medication management for mood stabilization; elimination of SI thoughts; development of comprehensive mental wellness/sobriety plan.  Patient Goals:  "Move forward."  Discharge Plan or Barriers: Expected to follow up with providers at the [facility].  Reason  for Continuation of Hospitalization: Aggression Anxiety Depression Hallucinations Homicidal ideation  Estimated Length of Stay: 1-3  days      Attendees: Patient:  02/24/2019   Physician: Dr. Malvin JohnsBrian Farah, MD 02/24/2019   Nursing: Dossie ArbourAnthony Adams, RN 02/24/2019   RN Care Manager:   Social Worker: Daleen SquibbGreg Estel Tonelli, LCSW  02/24/2019   Recreational Therapist:    Other:     Other:    Other:    Scribe for Treatment Team: [name], Neeti Knudtson Jon, LCSW 02/24/2019 1:27 PM

## 2019-02-24 NOTE — Progress Notes (Signed)
Recreation Therapy Notes  Date: 10.12.20 Time: 1000 Location: 400 Hall Dayroom  Group Topic: Coping Skills  Goal Area(s) Addresses:  Patient will identify positive coping skills. Patient will identify benefits of using positive coping strategies. Patient will identify how coping skills can be beneficial post d/c.  Intervention:  Worksheet, pencils  Activity:  Building surveyor.  Patients were given a blank Building surveyor.  Inside the web, patients were to identify and label what things/issues have held them back or stuck.  Patient were to then identify at least two coping skills for each situation they identified and write it outside the web.  Education: Radiographer, therapeutic, Dentist.   Education Outcome: Acknowledges understanding/In group clarification offered/Needs additional education.   Clinical Observations/Feedback: Pt did not attend group.    Victorino Sparrow, LRT/CTRS    Victorino Sparrow A 02/24/2019 11:25 AM

## 2019-02-24 NOTE — Progress Notes (Signed)
D: Pt denies SI/HI/AVH. Pt is pleasant and cooperative. Pt appropriate on the unit, stated he took LAI today, explained that he may get another shot due to him needing to get the 234 mg Invega .  A: Pt was offered support and encouragement. Pt was given scheduled medications. Pt was encourage to attend groups. Q 15 minute checks were done for safety.  R:Pt attends groups and interacts well with peers and staff. Pt is taking medication. Pt has no complaints.Pt receptive to treatment and safety maintained on unit.

## 2019-02-25 DIAGNOSIS — F250 Schizoaffective disorder, bipolar type: Secondary | ICD-10-CM | POA: Diagnosis not present

## 2019-02-25 NOTE — Progress Notes (Signed)
Patient ID: Zachary Eaton, male   DOB: 1985/06/30, 33 y.o.   MRN: [id number]  CSW contacted the patient transport coordinator at the [place] and spoke with, Claiborne Billings. Claiborne Billings stated that the packet was sent over for the patient on Sunday. She stated that right now they have 2 beds open but those have to be saved for ED placement. She stated that if one more bed opens then the patient would get that bed. Claiborne Billings took the [employer] number.  CSW attempted to contact pt's mother for an update. CSW left a HIPPA compliant voicemail.

## 2019-02-25 NOTE — Progress Notes (Signed)
Recreation Therapy Notes  Date: 10.13.20 Time: 1000 Location: 400 Hall Dayroom  Group Topic: Self-Esteem  Goal Area(s) Addresses:  Patient will successfully identify positive attributes about themselves.  Patient will successfully identify benefit of improved self-esteem.   Intervention: [employer], face outlines, music  Activity: How I See Me.  Patients were given a blank outline of a face. Using words and drawing, patients were to express how they see themselves and identify all positive characteristics.  Education:  Self-Esteem, Dentist.   Education Outcome: Acknowledges education/In group clarification offered/Needs additional education  Clinical Observations/Feedback: Pt did not attend group session.    Victorino Sparrow, LRT/CTRS         Ria Comment, Ethen Bannan A 02/25/2019 12:00 PM

## 2019-02-25 NOTE — Progress Notes (Signed)
The  Patient rated his day as a 7 out of a  Possible 10. He explained that he had a pretty good day overall and that he was able to exercise as well. His goal for tomorrow is to go home.

## 2019-02-25 NOTE — Progress Notes (Signed)
D: Pt been pleasant and appropriate on the unit this evening. Pt hoping to go home soon.   A: Pt was offered support and encouragement. Pt was given scheduled medications. Pt was encourage to attend groups. Q 15 minute checks were done for safety.   R:Pt attends groups and interacts well with peers and staff. Pt is taking medication. Pt has no complaints.Pt receptive to treatment and safety maintained on unit.

## 2019-02-25 NOTE — Progress Notes (Signed)
Patient ID: Zachary Eaton, male   DOB: March 27, 1986, 33 y.o.   MRN: [id number]  CSW received a phone call from April with [facility] patient transfer coordination. April asked that a new packet be filled out and sent back with an updated COVID test (within 48 hours).  CSW informed pt's nurse and doctor of needed COVID test.

## 2019-02-25 NOTE — Progress Notes (Signed)
[facility]  02/25/2019 7:42 AM Zachary LocketJoshua D Eaton  MRN:  [id number] Subjective:    Patient compliant with meds however somewhat passive, seeking discharge minimizing and denying previously expressed symptoms/previously described symptoms  Again he seems to think it is a realistic plan to simply get in his truck, put his possessions and [employer] and travel around however he has made no real plans regarding finances, stable living situation so forth.  So again he is not realistic about his future plans but he is taking meds here and denies hallucinations we see no volatility here.  No EPS or TD.  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 Migraines   . Traumatic brain injury [facility](HCC)     Past Surgical History:  Procedure Laterality Date  . ANKLE SURGERY Left    Family History: History reviewed. No pertinent family history. Family Psychiatric  History: see eval Social History:  Social History   Substance and Sexual Activity  Alcohol Use Yes   Comment: occ     Social History   Substance and Sexual Activity  Drug Use Not Currently    Social History   Socioeconomic History  . Marital status: Single    Spouse name: Not on file  . Number of children: Not on file  . Years of education: Not on file  . Highest education level: Not on file  Occupational History  . Not on file  Social Needs  . Financial resource strain: Not on file  . Food insecurity    Worry: Not on file    Inability: Not on file  . Transportation needs    Medical: Not on file    Non-medical: Not on file  Tobacco Use  . Smoking status: Current Some Day Smoker    Packs/day: 2.00    Types:  Cigarettes  . Smokeless tobacco: Current User    Types: Chew  . Tobacco comment: patient declined  Substance and Sexual Activity  . Alcohol use: Yes    Comment: occ  . Drug use: Not Currently  . Sexual activity: Not Currently  Lifestyle  . Physical activity    Days per week: Not on file    Minutes per session: Not on file  . Stress: Not on file  Relationships  . Social Musicianconnections    Talks on phone: Not on file    Gets together: Not on file    Attends religious service: Not on file    Active member of club or organization: Not on file    Attends meetings of clubs or organizations: Not on file    Relationship status: Not on file  Other Topics Concern  . Not on file  Social History Narrative  . Not on file   Additional Social History:                         Sleep: Good  Appetite:  Good  Current Medications: Current Facility-Administered Medications  Medication Dose Route Frequency Provider Last Rate Last Dose  . acetaminophen (TYLENOL) tablet 650 mg  650 mg Oral Q6H PRN Denzil Magnusonhomas, [place], NP   650 mg at 02/22/19 0155  . alum & mag hydroxide-simeth (  MAALOX/MYLANTA) 200-200-20 MG/5ML suspension 30 mL  30 mL Oral Q4H PRN [name], NP      . divalproex (DEPAKOTE) DR tablet 250 mg  250 mg Oral Daily [name], MD   250 mg at 02/24/19 0914  . divalproex (DEPAKOTE) DR tablet 500 mg  500 mg Oral QHS [name], MD   500 mg at 02/24/19 2036  . feeding supplement (ENSURE ENLIVE) (ENSURE ENLIVE) liquid 237 mL  237 mL Oral BID BM [name], MD   237 mL at 02/24/19 1413  . haloperidol lactate (HALDOL) injection 10 mg  10 mg Intramuscular BID PRN [name], MD   10 mg at 02/19/19 1628  . lithium carbonate capsule 450 mg  450 mg Oral QHS [name], MD   450 mg at 02/24/19 2037  . OLANZapine zydis (ZYPREXA) disintegrating tablet 10 mg  10 mg Oral Q8H PRN [name], MD   10 mg at 02/21/19 2344   And  . LORazepam (ATIVAN) tablet 1 mg  1  mg Oral PRN [name], MD      . magnesium hydroxide (MILK OF MAGNESIA) suspension 30 mL  30 mL Oral Daily PRN [name], NP      . nicotine polacrilex (NICORETTE) gum 2 mg  2 mg Oral PRN [name], NP   2 mg at 02/24/19 2035  . omega-3 acid ethyl esters (LOVAZA) capsule 1 g  1 g Oral BID [name], NP   1 g at 02/24/19 1653  . paliperidone (INVEGA SUSTENNA) injection 234 mg  234 mg Intramuscular Once [name], MD      . risperiDONE (RISPERDAL) tablet 6 mg  6 mg Oral QHS [name], MD   6 mg at 02/24/19 2036  . temazepam (RESTORIL) capsule 30 mg  30 mg Oral QHS [name], MD   30 mg at 02/24/19 2036    Lab Results: No results found for this or any previous visit (from the past 67 hour(s)).  Blood Alcohol level:  Lab Results  Component Value Date   ETH <10 02/17/2019   ETH <10 81/27/5170    Metabolic Disorder Labs: Lab Results  Component Value Date   HGBA1C 4.9 02/18/2019   MPG 93.93 02/18/2019   Lab Results  Component Value Date   PROLACTIN 27.8 (H) 02/18/2019   Lab Results  Component Value Date   CHOL 138 02/18/2019   TRIG 71 02/18/2019   HDL 44 02/18/2019   CHOLHDL 3.1 02/18/2019   VLDL 14 02/18/2019   LDLCALC 80 02/18/2019    Physical Findings: AIMS: Facial and Oral Movements Muscles of Facial Expression: None, normal Lips and Perioral Area: None, normal Jaw: None, normal Tongue: None, normal,Extremity Movements Upper (arms, wrists, hands, fingers): None, normal Lower (legs, knees, ankles, toes): None, normal, Trunk Movements Neck, shoulders, hips: None, normal, Overall Severity Severity of abnormal movements (highest score from questions above): None, normal Incapacitation due to abnormal movements: None, normal Patient's awareness of abnormal movements (rate only patient's report): No Awareness, Dental Status Current problems with teeth and/or dentures?: No Does patient usually wear dentures?: No  CIWA:  CIWA-Ar Total:  1 COWS:  COWS Total Score: 1  Musculoskeletal: Strength & Muscle Tone: within normal limits Gait & Station: normal Patient leans: N/A  Psychiatric Specialty Exam: Physical Exam  ROS  Blood pressure 122/79, pulse 82, temperature 97.9 F (36.6 C), temperature source Oral, resp. rate 16, height 5\' 11"  (1.803 m), weight 79.4 kg.Body mass index  is 24.41 kg/m.  General Appearance: Casual  Eye Contact:  Poor  Speech:  Normal Rate  Volume:  Decreased  Mood:  Dysphoric  Affect:  Blunt  Thought Process:  Linear and Descriptions of Associations: Circumstantial  Orientation:  Other:  Person place situation not exact date  Thought Content:  Illogical  Suicidal Thoughts:  No  Homicidal Thoughts:  No  Memory:  Immediate;   Poor Recent;   Poor Remote;   Fair  Judgement:  Poor  Insight:  Shallow  Psychomotor Activity:  Normal  Concentration:  Concentration: Poor and Attention Span: Poor  Recall:  [employer] of Knowledge:  Fair  Language:  Fair  Akathisia:  Negative  Handed:  Right  AIMS (if indicated):     Assets:  Resilience Social Support  ADL's:  Intact  Cognition:  WNL  Sleep:  Number of Hours: 6     Treatment Plan Summary: Daily contact with patient to assess and evaluate symptoms and progress in treatment and Medication management We believe that med management is appropriate at this point in time continue cognitive therapy we will also seek advice from his social worker at the [facility] as she is more aware of the program particularly the [facility] program.  No change in precautions today discussed with team and social work Malvin Johns, MD 02/25/2019, 7:42 AM

## 2019-02-26 DIAGNOSIS — F250 Schizoaffective disorder, bipolar type: Secondary | ICD-10-CM | POA: Diagnosis not present

## 2019-02-26 LAB — SARS CORONAVIRUS 2 (TAT 6-24 HRS): SARS Coronavirus 2: NEGATIVE

## 2019-02-26 NOTE — Progress Notes (Signed)
Patient ID: Zachary Eaton, male   DOB: February 24, 1986, 32 y.o.   MRN: [id number]   CSW met with patient to give him an update. Patient expressed being okay with going to the [place] but wanting to go home first. CSW expressed that a door to door transfer is what the doctor is requesting. CSW discussed giving the patient daily updates.
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
## 2019-02-26 NOTE — Progress Notes (Addendum)
Patient ID: Zachary Eaton, male   DOB: 1986-04-08, 33 y.o.   MRN: [id number]  CSW filled out all paperwork for the Patient Transport Coordination at the [facility] in [place], [place].   [place] got signatures obtained from Dr. Jake Samples.  CSW obtained the results for the COVID19 results.  CSW faxed the entire packet (at least 100 pages) to [place] at Patient Transport Coordination at the fax number [phone])  CSW called and left a voicemail for Claiborne Billings to inform that everything was just sent to the following phone number: [phone]. [phone]  CSW called pt's mother and gave her an update.

## 2019-02-26 NOTE — Progress Notes (Signed)
D: Pt denies SI/HI/AVH. Pt is pleasant and cooperative. Pt has been getting close to another pt (Corina) and seems they pace and sit in the hallway together , but it seems to energize the other pt .   A: Pt was offered support and encouragement. Pt was given scheduled medications. Pt was encourage to attend groups. Q 15 minute checks were done for safety.  R:Pt attends groups and interacts well with peers and staff. Pt is taking medication. Pt has no complaints.Pt receptive to treatment and safety maintained on unit.

## 2019-02-26 NOTE — Progress Notes (Signed)
Recreation Therapy Notes  Date: 10.14.20 Time: 1000 Location: 500 Hall Dayroom  Group Topic: Wellness  Goal Area(s) Addresses:  Patient will define components of whole wellness. Patient will verbalize benefit of whole wellness.  Behavioral Response:  Engaged  Intervention: Music     Activity:  Exercise. LRT and patients completed a workout routine created by the patients.  The routine consisted of squats, push ups, leg raises, side crunches, v-ups and planks.  Education: Wellness, Dentist.   Education Outcome: Acknowledges education/In group clarification offered/Needs additional education.   Clinical Observations/Feedback:  Pt was active and social with peers.  Pt would also dance to the music in between exercises.  Pt was bright and engaged throughout.     Victorino Sparrow, LRT/CTRS     Victorino Sparrow A 02/26/2019 10:46 AM

## 2019-02-27 DIAGNOSIS — F250 Schizoaffective disorder, bipolar type: Secondary | ICD-10-CM | POA: Diagnosis not present

## 2019-02-27 NOTE — Progress Notes (Signed)
The patient's positive event for the day is that he feels that he is getting closer to discharge. In addition, he expressed that he enjoyed socializing with his peers today. His goal for tomorrow is to get discharged.

## 2019-02-27 NOTE — Progress Notes (Deleted)
Looks like pt has not received his 234 mg Invega shot

## 2019-02-27 NOTE — BHH Group Notes (Addendum)
LCSW Group Therapy Note  Date and Time: 02/27/2019 @ 1:30pm  Type of Therapy and Topic: Group Therapy: Feelings Around Returning Home & Establishing a Supportive Framework and Supporting Oneself When Supports Not Available  Participation Level: B[facility] PARTICIPATION LEVEL: Active  Mood: Pleasant    Description of Group:  Patients first processed thoughts and feelings about upcoming discharge. These included fears of upcoming changes, lack of change, new living environments, judgements and expectations from others and overall stigma of mental health issues. The group then discussed the definition of a supportive framework, what that looks and feels like, and how do to discern it from an unhealthy non-supportive network. The group identified different types of supports as well as what to do when your family/friends are less than helpful or unavailable     Therapeutic Goals   1.  Patient will identify one healthy supportive network that they can use at discharge.  2.  Patient will identify one factor of a supportive framework and how to tell it from an unhealthy network.  3.  Patient able to identify one coping skill to use when they do not have positive supports from others.  4.  Patient will demonstrate ability to communicate their needs through discussion and/or role plays.     Summary of Patient Progress:       Patient was active and engaged throughout group therapy today. Patient was able to identify his healthy supportive network as his family and the [place]. Patient reports that the factors that make unhealthy networks is not understanding, not answering phone calls, making excuses, and not wanting the patient around because of outside forces (for example: law enforcement). Patient stated that what makes a supportive framework is people who call and check up on him. Patient reports that a way he is going to support himself when he discharges is getting back out in the the world and being  around people that he knows will support him for the better.      Therapeutic Modalities  Cognitive Behavioral Therapy  Motivational Interviewing   Ardelle Anton, MSW, [place]

## 2019-02-27 NOTE — Progress Notes (Signed)
Recreation Therapy Notes  Date: 10.15.20 Time: 1000 Location:  500 Hall Dayroom  Group Topic: Communication, Team Building, Problem Solving  Goal Area(s) Addresses:  Patient will effectively work with peer towards shared goal.  Patient will identify skills used to make activity successful.  Patient will identify how skills used during activity can be used to reach post d/c goals.   Intervention: STEM Activity  Activity: Geophysicist/field seismologist. In teams patients were given 12 plastic drinking straws and a length of masking tape. Using the materials provided patients were asked to build a landing pad to catch a golf ball dropped from approximately 6 feet in the air.   Education: Education officer, community, Discharge Planning   Education Outcome: Acknowledges education/In group clarification offered/Needs additional education.   Clinical Observations/Feedback:  Pt did not attend group.    Victorino Sparrow, LRT/CTRS         Victorino Sparrow A 02/27/2019 12:10 PM

## 2019-02-27 NOTE — Progress Notes (Signed)
[facility] MD Progress Note  02/27/2019 9:26 AM Zachary Eaton  MRN:  [id number] Subjective:   Patient resting well compliant with meds still not reality based as far as discharge planning believes he can simply get a truck and roam around but of course he does need further rehab and treatment at the [facility], waiting beds paperwork is submitted.  Again no change in medications he denies auditory or visual hallucinations but again his thought disorder extends to poor planning and poor executive functioning Principal Problem: Multiple traumatic brain injuries and PTSD symptomatology presenting as schizoaffective condition recent volatility Diagnosis: Active Problems:   Psychosis ([place])  Total Time spent with patient: 20 minutes  Past Medical History:  Past Medical History:  Diagnosis Date  . Migraines   . Traumatic brain injury [facility])     Past Surgical History:  Procedure Laterality Date  . ANKLE SURGERY Left    Family History: History reviewed. No pertinent family history. Family Psychiatric  History: Negative Social History:  Social History   Substance and Sexual Activity  Alcohol Use Yes   Comment: occ     Social History   Substance and Sexual Activity  Drug Use Not Currently    Social History   Socioeconomic History  . Marital status: Single    Spouse name: Not on file  . Number of children: Not on file  . Years of education: Not on file  . Highest education level: Not on file  Occupational History  . Not on file  Social Needs  . Financial resource strain: Not on file  . Food insecurity    Worry: Not on file    Inability: Not on file  . Transportation needs    Medical: Not on file    Non-medical: Not on file  Tobacco Use  . Smoking status: Current Some Day Smoker    Packs/day: 2.00    Types: Cigarettes  . Smokeless tobacco: Current User    Types: Chew  . Tobacco comment: patient declined  Substance and Sexual Activity  . Alcohol use: Yes     Comment: occ  . Drug use: Not Currently  . Sexual activity: Not Currently  Lifestyle  . Physical activity    Days per week: Not on file    Minutes per session: Not on file  . Stress: Not on file  Relationships  . Social Herbalist on phone: Not on file    Gets together: Not on file    Attends religious service: Not on file    Active member of club or organization: Not on file    Attends meetings of clubs or organizations: Not on file    Relationship status: Not on file  Other Topics Concern  . Not on file  Social History Narrative  . Not on file   Additional Social History:                         Sleep: Fair  Appetite:  Fair  Current Medications: Current Facility-Administered Medications  Medication Dose Route Frequency Provider Last Rate Last Dose  . acetaminophen (TYLENOL) tablet 650 mg  650 mg Oral Q6H PRN [name], NP   650 mg at 02/22/19 0155  . alum & mag hydroxide-simeth (MAALOX/MYLANTA) 200-200-20 MG/5ML suspension 30 mL  30 mL Oral Q4H PRN [name], NP      . divalproex (DEPAKOTE) DR tablet 250 mg  250 mg Oral Daily [name],  MD   250 mg at 02/27/19 0823  . divalproex (DEPAKOTE) DR tablet 500 mg  500 mg Oral QHS Antonieta Pert, MD   500 mg at 02/26/19 2058  . feeding supplement (ENSURE ENLIVE) (ENSURE ENLIVE) liquid 237 mL  237 mL Oral BID BM Malvin Johns, MD   237 mL at 02/27/19 0823  . haloperidol lactate (HALDOL) injection 10 mg  10 mg Intramuscular BID PRN Malvin Johns, MD   10 mg at 02/19/19 1628  . lithium carbonate capsule 450 mg  450 mg Oral QHS Antonieta Pert, MD   450 mg at 02/26/19 2057  . OLANZapine zydis (ZYPREXA) disintegrating tablet 10 mg  10 mg Oral Q8H PRN Antonieta Pert, MD   10 mg at 02/21/19 2344   And  . LORazepam (ATIVAN) tablet 1 mg  1 mg Oral PRN Antonieta Pert, MD      . magnesium hydroxide (MILK OF MAGNESIA) suspension 30 mL  30 mL Oral Daily PRN Denzil Magnuson, NP      . nicotine  polacrilex (NICORETTE) gum 2 mg  2 mg Oral PRN Jearld Lesch, NP   2 mg at 02/26/19 0746  . omega-3 acid ethyl esters (LOVAZA) capsule 1 g  1 g Oral BID Denzil Magnuson, NP   1 g at 02/27/19 4580  . paliperidone (INVEGA SUSTENNA) injection 234 mg  234 mg Intramuscular Once Malvin Johns, MD      . risperiDONE (RISPERDAL) tablet 6 mg  6 mg Oral QHS Malvin Johns, MD   6 mg at 02/26/19 2058  . temazepam (RESTORIL) capsule 30 mg  30 mg Oral QHS Malvin Johns, MD   30 mg at 02/26/19 2058    Lab Results:  Results for orders placed or performed during the hospital encounter of 02/17/19 (from the past 48 hour(s))  SARS CORONAVIRUS 2 (TAT 6-24 HRS) Nasopharyngeal Nasopharyngeal Swab     Status: None   Collection Time: 02/26/19  8:39 AM   Specimen: Nasopharyngeal Swab  Result Value Ref Range   SARS Coronavirus 2 NEGATIVE NEGATIVE    Comment: (NOTE) SARS-CoV-2 target nucleic acids are NOT DETECTED. The SARS-CoV-2 RNA is generally detectable in upper and lower respiratory specimens during the acute phase of infection. Negative results do not preclude SARS-CoV-2 infection, do not rule out co-infections with other pathogens, and should not be used as the sole basis for treatment or other patient management decisions. Negative results must be combined with clinical observations, patient history, and epidemiological information. The expected result is Negative. Fact Sheet for Patients: HairSlick.no Fact Sheet for Healthcare Providers: quierodirigir.com This test is not yet approved or cleared by the [place] FDA and  has been authorized for detection and/or diagnosis of SARS-CoV-2 by FDA under an Emergency Use Authorization (EUA). This EUA will remain  in effect (meaning this test can be used) for the duration of the COVID-19 declaration under Section 56 4(b)(1) of the Act, 21 U.S.C. section 360bbb-3(b)(1), unless the authorization is  terminated or revoked sooner. Performed at [facility], [address] [place], [place] [address]     Blood Alcohol level:  Lab Results  Component Value Date   [facility] <10 02/17/2019   ETH <10 06/11/2018    Metabolic Disorder Labs: Lab Results  Component Value Date   HGBA1C 4.9 02/18/2019   MPG 93.93 02/18/2019   Lab Results  Component Value Date   PROLACTIN 27.8 (H) 02/18/2019   Lab Results  Component Value Date  CHOL 138 02/18/2019   TRIG 71 02/18/2019   HDL 44 02/18/2019   CHOLHDL 3.1 02/18/2019   VLDL 14 02/18/2019   LDLCALC 80 02/18/2019    Physical Findings: AIMS: Facial and Oral Movements Muscles of Facial Expression: None, normal Lips and Perioral Area: None, normal Jaw: None, normal Tongue: None, normal,Extremity Movements Upper (arms, wrists, hands, fingers): None, normal Lower (legs, knees, ankles, toes): None, normal, Trunk Movements Neck, shoulders, hips: None, normal, Overall Severity Severity of abnormal movements (highest score from questions above): None, normal Incapacitation due to abnormal movements: None, normal Patient's awareness of abnormal movements (rate only patient's report): No Awareness, Dental Status Current problems with teeth and/or dentures?: No Does patient usually wear dentures?: No  CIWA:  CIWA-Ar Total: 1 COWS:  COWS Total Score: 1  Musculoskeletal: Strength & Muscle Tone: within normal limits Gait & Station: normal Patient leans: N/A  Psychiatric Specialty Exam: Physical Exam  ROS  Blood pressure 122/81, pulse 99, temperature 97.9 F (36.6 C), temperature source Oral, resp. rate 16, height 5\' 11"  (1.803 m), weight 79.4 kg.Body mass index is 24.41 kg/m.  General Appearance: Casual  Eye Contact:  Fair  Speech:  Clear and Coherent  Volume:  Decreased  Mood:  Dysphoric  Affect:  Blunt  Thought Process:  Linear and Descriptions of Associations: Circumstantial  Orientation:  Full (Time, Place, and Person)   Thought Content:  Illogical and Rumination  Suicidal Thoughts:  No  Homicidal Thoughts:  No  Memory:  Immediate;   Fair Recent;   Fair Remote;   Fair  Judgement:  Fair  Insight:  Fair  Psychomotor Activity:  Decreased  Concentration:  Concentration: Fair and Attention Span: Fair  Recall:  [employer]Fair  Fund of Knowledge:  Fair  Language:  Fair  Akathisia:  Negative  Handed:  Right  AIMS (if indicated):     Assets:  Communication Skills Housing Leisure Time Physical Health Resilience  ADL's:  Intact  Cognition:  WNL  Sleep:  Number of Hours: 6.5     Treatment Plan Summary: Daily contact with patient to assess and evaluate symptoms and progress in treatment and Medication management  Continue current cognitive therapy rehab based therapy no change in precautions or meds probable [facility]  Malvin JohnsFARAH,Sharlie Shreffler, MD 02/27/2019, 9:26 AM

## 2019-02-27 NOTE — Progress Notes (Signed)
D: Pt denies SI/HI/AVH. Pt is pleasant and cooperative. Pt stated he was getting restless being in here, but pt said he was going to ride it out.  A: Pt was offered support and encouragement. Pt was given scheduled medications. Pt was encourage to attend groups. Q 15 minute checks were done for safety.  R:Pt attends groups and interacts well with peers and staff. Pt is taking medication. Pt has no complaints.Pt receptive to treatment and safety maintained on unit.

## 2019-02-27 NOTE — Progress Notes (Signed)
Patient ID: LETICIA MCDIARMID, male   DOB: Dec 22, 1985, 33 y.o.   MRN: [id number]  CSW contacted Claiborne Billings at the [facility] in [place] with the Patient Transport Coordination department. Claiborne Billings stated that she received all the paperwork for the patient but right now they are just waiting on bed availability for him. Claiborne Billings asked for CSW to resend updated IVC paperwork. CSW informed Claiborne Billings that CSW will resend those when given.

## 2019-02-28 DIAGNOSIS — F250 Schizoaffective disorder, bipolar type: Secondary | ICD-10-CM | POA: Diagnosis not present

## 2019-02-28 MED ORDER — SALINE SPRAY 0.65 % NA SOLN
1.0000 | NASAL | Status: DC | PRN
  Administered 2019-02-28: 23:00:00 1 via NASAL
  Filled 2019-02-28: qty 44

## 2019-02-28 NOTE — Progress Notes (Signed)
Spirituality group facilitated by Simone Curia, MDiv, B[facility].  Group Description:  Group focused on topic of hope.  Patients participated in facilitated discussion around topic, connecting with one another around experiences and definitions for hope.  Group members engaged with the sentence "hope is," reflecting on what hope looks like for them today.  Group engaged in discussion around how their definitions of hope are present today in hospital.   Modalities: Psycho-social ed, Adlerian, Narrative, MI Patient Progress: Zachary Eaton - who introduced himself as Zachary Eaton -  was present throughout group.  He noted that as long as people care for one another, he feels like he has hope.  He later noted that he can find hope in caring for himself.  Gave metaphor of driving a car and deciding whom to invite on board - spoke about this being like relationships that are helpful for him.  Identified his counselor at [facility] as a helpful relationship.

## 2019-02-28 NOTE — Tx Team (Signed)
Interdisciplinary Treatment and Diagnostic Plan Update  02/28/2019 Time of Session: 10:30am Leontine LocketJoshua D Sarris MRN: [id number]  Principal Diagnosis: <principal problem not specified>  Secondary Diagnoses: Active Problems:   Psychosis (HCC)   Current Medications:  Current Facility-Administered Medications  Medication Dose Route Frequency Provider Last Rate Last Dose  . acetaminophen (TYLENOL) tablet 650 mg  650 mg Oral Q6H PRN Denzil Magnusonhomas, Lashunda, NP   650 mg at 02/22/19 0155  . alum & mag hydroxide-simeth (MAALOX/MYLANTA) 200-200-20 MG/5ML suspension 30 mL  30 mL Oral Q4H PRN Denzil Magnusonhomas, Lashunda, NP      . divalproex (DEPAKOTE) DR tablet 250 mg  250 mg Oral Daily Malvin JohnsFarah, Brian, MD   250 mg at 02/28/19 [id number]  . divalproex (DEPAKOTE) DR tablet 500 mg  500 mg Oral QHS Antonieta Pertlary, Greg Lawson, MD   500 mg at 02/27/19 2050  . feeding supplement (ENSURE ENLIVE) (ENSURE ENLIVE) liquid 237 mL  237 mL Oral BID BM Malvin JohnsFarah, Brian, MD   237 mL at 02/27/19 1501  . haloperidol lactate (HALDOL) injection 10 mg  10 mg Intramuscular BID PRN Malvin JohnsFarah, Brian, MD   10 mg at 02/19/19 1628  . lithium carbonate capsule 450 mg  450 mg Oral QHS Antonieta Pertlary, Greg Lawson, MD   450 mg at 02/27/19 2050  . OLANZapine zydis (ZYPREXA) disintegrating tablet 10 mg  10 mg Oral Q8H PRN Antonieta Pertlary, Greg Lawson, MD   10 mg at 02/21/19 2344   And  . LORazepam (ATIVAN) tablet 1 mg  1 mg Oral PRN Antonieta Pertlary, Greg Lawson, MD      . magnesium hydroxide (MILK OF MAGNESIA) suspension 30 mL  30 mL Oral Daily PRN Denzil Magnusonhomas, Lashunda, NP      . nicotine polacrilex (NICORETTE) gum 2 mg  2 mg Oral PRN Jearld Leschixon, Rashaun M, NP   2 mg at 02/26/19 0746  . omega-3 acid ethyl esters (LOVAZA) capsule 1 g  1 g Oral BID Denzil Magnusonhomas, Lashunda, NP   1 g at 02/28/19 [id number]  . paliperidone (INVEGA SUSTENNA) injection 234 mg  234 mg Intramuscular Once Malvin JohnsFarah, Brian, MD      . risperiDONE (RISPERDAL) tablet 6 mg  6 mg Oral QHS Malvin JohnsFarah, Brian, MD   6 mg at 02/27/19 2050  . temazepam (RESTORIL)  capsule 30 mg  30 mg Oral QHS Malvin JohnsFarah, Brian, MD   30 mg at 02/27/19 2050   PTA Medications: Medications Prior to Admission  Medication Sig Dispense Refill Last Dose  . divalproex (DEPAKOTE SPRINKLE) 125 MG capsule Take 2 capsules (250 mg total) by mouth every 12 (twelve) hours. (Patient not taking: Reported on 02/18/2019) 60 capsule 1 Not Taking at Unknown time  . lithium carbonate 150 MG capsule Take 1 capsule (150 mg total) by mouth at bedtime. (Patient not taking: Reported on 02/18/2019) 60 capsule 1 Not Taking at Unknown time  . omega-3 acid ethyl esters (LOVAZA) 1 g capsule Take 1 capsule (1 g total) by mouth 2 (two) times daily. (Patient not taking: Reported on 02/18/2019) 60 capsule 1 Not Taking at Unknown time    Patient Stressors: Substance abuse Traumatic event  Patient Strengths: Ability for insight Communication skills General fund of knowledge Supportive family/friends  Treatment Modalities: Medication Management, Group therapy, Case management,  1 to 1 session with clinician, Psychoeducation, Recreational therapy.   Physician Treatment Plan for Primary Diagnosis: <principal problem not specified> Long Term Goal(s): Improvement in symptoms so as ready for discharge Improvement in symptoms so as ready for discharge   Short Term Goals:  Ability to identify changes in lifestyle to reduce recurrence of condition will improve Ability to verbalize feelings will improve Ability to disclose and discuss suicidal ideas Ability to demonstrate self-control will improve Ability to identify and develop effective coping behaviors will improve Ability to verbalize feelings will improve Ability to disclose and discuss suicidal ideas Ability to demonstrate self-control will improve Ability to identify and develop effective coping behaviors will improve  Medication Management: Evaluate patient's response, side effects, and tolerance of medication regimen.  Therapeutic Interventions: 1 to 1  sessions, Unit Group sessions and Medication administration.  Evaluation of Outcomes: Progressing  Physician Treatment Plan for Secondary Diagnosis: Active Problems:   Psychosis ([place])  Long Term Goal(s): Improvement in symptoms so as ready for discharge Improvement in symptoms so as ready for discharge   Short Term Goals: Ability to identify changes in lifestyle to reduce recurrence of condition will improve Ability to verbalize feelings will improve Ability to disclose and discuss suicidal ideas Ability to demonstrate self-control will improve Ability to identify and develop effective coping behaviors will improve Ability to verbalize feelings will improve Ability to disclose and discuss suicidal ideas Ability to demonstrate self-control will improve Ability to identify and develop effective coping behaviors will improve     Medication Management: Evaluate patient's response, side effects, and tolerance of medication regimen.  Therapeutic Interventions: 1 to 1 sessions, Unit Group sessions and Medication administration.  Evaluation of Outcomes: Progressing   RN Treatment Plan for Primary Diagnosis: <principal problem not specified> Long Term Goal(s): Knowledge of disease and therapeutic regimen to maintain health will improve  Short Term Goals: Ability to participate in decision making will improve, Ability to verbalize feelings will improve, Ability to disclose and discuss suicidal ideas, Ability to identify and develop effective coping behaviors will improve and Compliance with prescribed medications will improve  Medication Management: RN will administer medications as ordered by provider, will assess and evaluate patient's response and provide education to patient for prescribed medication. RN will report any adverse and/or side effects to prescribing provider.  Therapeutic Interventions: 1 on 1 counseling sessions, Psychoeducation, Medication administration, Evaluate responses  to treatment, Monitor vital signs and CBGs as ordered, Perform/monitor CIWA, COWS, AIMS and Fall Risk screenings as ordered, Perform wound care treatments as ordered.  Evaluation of Outcomes: Progressing   LCSW Treatment Plan for Primary Diagnosis: <principal problem not specified> Long Term Goal(s): Safe transition to appropriate next level of care at discharge, Engage patient in therapeutic group addressing interpersonal concerns.  Short Term Goals: Engage patient in aftercare planning with referrals and resources and Increase skills for wellness and recovery  Therapeutic Interventions: Assess for all discharge needs, 1 to 1 time with Social worker, Explore available resources and support systems, Assess for adequacy in community support network, Educate family and significant other(s) on suicide prevention, Complete Psychosocial Assessment, Interpersonal group therapy.  Evaluation of Outcomes: Progressing   Progress in Treatment: Attending groups: Yes. Participating in groups: Yes. Taking medication as prescribed: Yes. Toleration medication: Yes. Family/Significant other contact made: Yes, individual(s) contacted:  pt's mother Patient understands diagnosis: No. Discussing patient identified problems/goals with staff: Yes. Medical problems stabilized or resolved: Yes. Denies suicidal/homicidal ideation: Yes. Issues/concerns per patient self-inventory: No. Other:   New problem(s) identified: No, Describe:  None  New Short Term/Long Term Goal(s): Medication stabilization, elimination of SI thoughts, and development of a comprehensive mental wellness plan.   Patient Goals:  "Move forward"  Discharge Plan or Barriers: CSW is working with [place] in [place] to  get patient transferred to their unit.   Reason for Continuation of Hospitalization: Anxiety Medication stabilization  Estimated Length of Stay: 2-3 days  Attendees: Patient: 02/28/2019   Physician: Dr. Malvin Johns, MD  02/28/2019   Nursing: Casimiro Needle, RN 02/28/2019   RN Care Manager: 02/28/2019   Social Worker: Stephannie Peters, LCSW 02/28/2019   Recreational Therapist:  02/28/2019   MSW Intern: Earlyne Iba 02/28/2019   Other:  02/28/2019   Other: 02/28/2019       Scribe for Treatment Team: Delphia Grates, LCSW 02/28/2019 11:30 AM

## 2019-02-28 NOTE — Progress Notes (Signed)
[facility]  02/28/2019 11:03 AM Zachary Eaton  MRN:  [id number] Subjective: Patient is generally cordial cooperative passive focused on discharge denying all symptoms he still has some short-term memory deficits and lacks insight and continues to deny the destruction of property at home but he is understanding that he is hopeful to be placed at the [facility] program and we are waiting for beds to open up.  Unsafe to discharge him prior to that Principal Problem: As above history of TBI's PTSD and schizoaffective symptomatology Diagnosis: Active Problems:   Psychosis (HCC)  Total Time spent with patient: 20 minutes  Past Psychiatric History: see eval  Past Medical History:  Past Medical History:  Diagnosis Date  . Migraines   . Traumatic brain injury [facility](HCC)     Past Surgical History:  Procedure Laterality Date  . ANKLE SURGERY Left    Family History: History reviewed. No pertinent family history. Family Psychiatric  History: see eval Social History:  Social History   Substance and Sexual Activity  Alcohol Use Yes   Comment: occ     Social History   Substance and Sexual Activity  Drug Use Not Currently    Social History   Socioeconomic History  . Marital status: Single    Spouse name: Not on file  . Number of children: Not on file  . Years of education: Not on file  . Highest education level: Not on file  Occupational History  . Not on file  Social Needs  . Financial resource strain: Not on file  . Food insecurity    Worry: Not on file    Inability: Not on file  . Transportation needs    Medical: Not on file    Non-medical: Not on file  Tobacco Use  . Smoking status: Current Some Day Smoker    Packs/day: 2.00    Types: Cigarettes  . Smokeless tobacco: Current User    Types: Chew  . Tobacco comment: patient declined  Substance and Sexual Activity  . Alcohol use: Yes    Comment: occ  . Drug use: Not Currently  . Sexual activity: Not Currently   Lifestyle  . Physical activity    Days per week: Not on file    Minutes per session: Not on file  . Stress: Not on file  Relationships  . Social Musicianconnections    Talks on phone: Not on file    Gets together: Not on file    Attends religious service: Not on file    Active member of club or organization: Not on file    Attends meetings of clubs or organizations: Not on file    Relationship status: Not on file  Other Topics Concern  . Not on file  Social History Narrative  . Not on file   Additional Social History:                         Sleep: Fair  Appetite:  Fair  Current Medications: Current Facility-Administered Medications  Medication Dose Route Frequency Provider Last Rate Last Dose  . acetaminophen (TYLENOL) tablet 650 mg  650 mg Oral Q6H PRN Denzil Magnusonhomas, Lashunda, NP   650 mg at 02/22/19 0155  . alum & mag hydroxide-simeth (MAALOX/MYLANTA) 200-200-20 MG/5ML suspension 30 mL  30 mL Oral Q4H PRN Denzil Magnusonhomas, Lashunda, NP      . divalproex (DEPAKOTE) DR tablet 250 mg  250 mg Oral Daily Malvin JohnsFarah, Johnnisha Forton, MD  250 mg at 02/28/19 4098  . divalproex (DEPAKOTE) DR tablet 500 mg  500 mg Oral QHS Antonieta Pert, MD   500 mg at 02/27/19 2050  . feeding supplement (ENSURE ENLIVE) (ENSURE ENLIVE) liquid 237 mL  237 mL Oral BID BM Malvin Johns, MD   237 mL at 02/27/19 1501  . haloperidol lactate (HALDOL) injection 10 mg  10 mg Intramuscular BID PRN Malvin Johns, MD   10 mg at 02/19/19 1628  . lithium carbonate capsule 450 mg  450 mg Oral QHS Antonieta Pert, MD   450 mg at 02/27/19 2050  . OLANZapine zydis (ZYPREXA) disintegrating tablet 10 mg  10 mg Oral Q8H PRN Antonieta Pert, MD   10 mg at 02/21/19 2344   And  . LORazepam (ATIVAN) tablet 1 mg  1 mg Oral PRN Antonieta Pert, MD      . magnesium hydroxide (MILK OF MAGNESIA) suspension 30 mL  30 mL Oral Daily PRN Denzil Magnuson, NP      . nicotine polacrilex (NICORETTE) gum 2 mg  2 mg Oral PRN Jearld Lesch, NP   2 mg  at 02/26/19 0746  . omega-3 acid ethyl esters (LOVAZA) capsule 1 g  1 g Oral BID Denzil Magnuson, NP   1 g at 02/28/19 1191  . paliperidone (INVEGA SUSTENNA) injection 234 mg  234 mg Intramuscular Once Malvin Johns, MD      . risperiDONE (RISPERDAL) tablet 6 mg  6 mg Oral QHS Malvin Johns, MD   6 mg at 02/27/19 2050  . temazepam (RESTORIL) capsule 30 mg  30 mg Oral QHS Malvin Johns, MD   30 mg at 02/27/19 2050    Lab Results: No results found for this or any previous visit (from the past 48 hour(s)).  Blood Alcohol level:  Lab Results  Component Value Date   ETH <10 02/17/2019   ETH <10 06/11/2018    Metabolic Disorder Labs: Lab Results  Component Value Date   HGBA1C 4.9 02/18/2019   MPG 93.93 02/18/2019   Lab Results  Component Value Date   PROLACTIN 27.8 (H) 02/18/2019   Lab Results  Component Value Date   CHOL 138 02/18/2019   TRIG 71 02/18/2019   HDL 44 02/18/2019   CHOLHDL 3.1 02/18/2019   VLDL 14 02/18/2019   LDLCALC 80 02/18/2019    Physical Findings: AIMS: Facial and Oral Movements Muscles of Facial Expression: None, normal Lips and Perioral Area: None, normal Jaw: None, normal Tongue: None, normal,Extremity Movements Upper (arms, wrists, hands, fingers): None, normal Lower (legs, knees, ankles, toes): None, normal, Trunk Movements Neck, shoulders, hips: None, normal, Overall Severity Severity of abnormal movements (highest score from questions above): None, normal Incapacitation due to abnormal movements: None, normal Patient's awareness of abnormal movements (rate only patient's report): No Awareness, Dental Status Current problems with teeth and/or dentures?: No Does patient usually wear dentures?: No  CIWA:  CIWA-Ar Total: 1 COWS:  COWS Total Score: 1  Musculoskeletal: Strength & Muscle Tone: within normal limits  Gait & Station: normal Patient leans: N/A  Psychiatric Specialty Exam: Physical Exam  ROS  Blood pressure 115/69, pulse 74,  temperature 97.9 F (36.6 C), temperature source Oral, resp. rate 16, height 5\' 11"  (1.803 m), weight 79.4 kg.Body mass index is 24.41 kg/m.  General Appearance: Casual  Eye Contact:  Good  Speech:  Clear and Coherent  Volume:  Decreased  Mood:  Dysphoric  Affect:  Blunt  Thought Process:  Coherent, Linear  and Descriptions of Associations: Circumstantial  Orientation:  Full (Time, Place, and Person)  Thought Content:  Logical  Suicidal Thoughts:  No  Homicidal Thoughts:  No  Memory:  Immediate;   Fair Recent;   Fair Remote;   Fair  Judgement:  Fair  Insight:  Fair  Psychomotor Activity:  Normal  Concentration:  Concentration: Fair and Attention Span: Fair  Recall:  [employer] of Knowledge:  Fair  Language:  Fair  Akathisia:  Negative  Handed:  Right  AIMS (if indicated):     Assets:  Communication Skills Desire for Improvement Financial Resources/Insurance  ADL's:  Intact  Cognition:  WNL  Sleep:  Number of Hours: 6.5     Treatment Plan Summary: Daily contact with patient to assess and evaluate symptoms and progress in treatment, Medication management and Plan `Continue current cognitive and rehab based therapies no change in precautions await word from [facility] regarding longer term treatment center.  No change in meds for TBI/schizoaffective type symptomatology  Karem Farha, MD 02/28/2019, 11:03 AM

## 2019-02-28 NOTE — Progress Notes (Signed)
Recreation Therapy Notes  Date: 10.16.20 Time: 1015 Location: 500 Hall Dayroom  Group Topic: Self-Esteem  Goal Area(s) Addresses:  Patient will successfully identify positive attributes about themselves.  Patient will successfully identify benefit of improved self-esteem.   Behavioral Response: Engaged  Intervention: Markers, colored pencils, construction paper, outline of license plate, music  Activity: Personalized Plate.  Patients were to create a personalized licensed plate that highlighted all of the positive qualities about them and anything that makes them unique.  Education:  Self-Esteem, Dentist.   Education Outcome: Acknowledges education/In group clarification offered/Needs additional education  Clinical Observations/Feedback: Pt wrote 2 Fast 4 U on his plate.  Pt incorporated a street of gold and lightening.  Pt stated "heaven is riding and listening to music".  Pt also added a rainbow bridge like the movie Thor.   Victorino Sparrow, LRT/CTRS         Victorino Sparrow A 02/28/2019 12:26 PM

## 2019-02-28 NOTE — Progress Notes (Signed)
Pt up to the nursing station complaining of nasal congestion. NP- Corene Cornea ordered Saline Spray  , given per [facility]

## 2019-02-28 NOTE — Progress Notes (Signed)
Patient ID: Zachary Eaton, male   DOB: 03/02/1986, 33 y.o.   MRN: [id number]   CSW called Claiborne Billings at the [facility] (Patient [facility]) about updates on the patient.  There are no beds at this time. Claiborne Billings stated that for the weekend to call [phone] ext. [phone] (Which is the AOD).  If there is a bed for the patient then the [facility] will need the updated IVC paperwork faxed to them.

## 2019-02-28 NOTE — Progress Notes (Signed)
D: Pt denies SI/HI/AVH. Pt is pleasant and cooperative. Pt continues to be compliant with Tx in anticipation for D/C. Pt stated he was doing what he needed to do to leave.  A: Pt was offered support and encouragement. Pt was given scheduled medications. Pt was encourage to attend groups. Q 15 minute checks were done for safety.  R:Pt attends groups and interacts well with peers and staff. Pt is taking medication. Pt has no complaints.Pt receptive to treatment and safety maintained on unit.

## 2019-03-01 DIAGNOSIS — F3110 Bipolar disorder, current episode manic without psychotic features, unspecified: Secondary | ICD-10-CM | POA: Diagnosis not present

## 2019-03-01 NOTE — Plan of Care (Signed)
  Problem: Coping: Goal: Coping ability will improve Outcome: Progressing Goal: Will verbalize feelings Outcome: Progressing   

## 2019-03-01 NOTE — Progress Notes (Signed)
Patient has been in the dayroom and up and down the hallway following [place] around. They have both had to be reminded numerous times about no touching and keep safe distance from each other. He has been more focused on her and her needs than himself. Mht spoke with him about the importance of focusing on his treatment while here. He was receptive. Safety maintained on unit with 15 min checks.

## 2019-03-01 NOTE — [facility] Counselor (Signed)
Clinical Social Work Note  CSW called [facility] AOD at [phone] ext. [phone] to check on bed availability.  There are no available beds today so patient is not yet able to transfer.  Does not believe there will be discharges today that would open up a bed, but agreeable to CSW calling again tomorrow to check.  Selmer Dominion, LCSW 03/01/2019, 9:09 AM

## 2019-03-01 NOTE — Progress Notes (Signed)
Patient ID: Zachary Eaton, male   DOB: 08/24/1985, 33 y.o.   MRN: [id number]  [place] NOVEL CORONAVIRUS (COVID-19) DAILY CHECK-OFF SYMPTOMS - answer yes or no to each - every day NO YES  Have you had a fever in the past 24 hours?  . Fever (Temp > 37.80C / 100F) X   Have you had any of these symptoms in the past 24 hours? . New Cough .  Sore Throat  .  Shortness of Breath .  Difficulty Breathing .  Unexplained Body Aches   X   Have you had any one of these symptoms in the past 24 hours not related to allergies?   . Runny Nose .  Nasal Congestion .  Sneezing   X   If you have had runny nose, nasal congestion, sneezing in the past 24 hours, has it worsened?  X   EXPOSURES - check yes or no X   Have you traveled outside the state in the past 14 days?  X   Have you been in contact with someone with a confirmed diagnosis of COVID-19 or PUI in the past 14 days without wearing appropriate PPE?  X   Have you been living in the same home as a person with confirmed diagnosis of COVID-19 or a PUI (household contact)?    X   Have you been diagnosed with COVID-19?    X              What to do next: Answered NO to all: Answered YES to anything:   Proceed with unit schedule Follow the [facility] Inpatient Flowsheet.   

## 2019-03-01 NOTE — Progress Notes (Signed)
Adult Psychoeducational Group Note  Date:  03/01/2019 Time:  1:15 AM  Group Topic/Focus:  Wrap-Up Group:   The focus of this group is to help patients review their daily goal of treatment and discuss progress on daily workbooks.  Participation Level:  Active  Participation Quality:  Appropriate  Affect:  Appropriate  Cognitive:  Appropriate  Insight: Appropriate  Engagement in Group:  Developing/Improving  Modes of Intervention:  Discussion  Additional Comments:  Pt stated his goal for today was to talk with his doctor about his discharge plan. Pt stated he talk with with the doctor but his discharge plan was not discuss at this time. Pt stated his relationship with his family had improved since he was admitted here. Pt stated talking to his parents and sister today help improve his day.  Pt stated he felt better about himself today. Pt rated his overall day a 7 out of 10. Pt stated his appetite was improved today. Pt stated his goal for tonight is to get some rest. Pt did not complain of any pain tonight. Pt stated he was not hearing voices or seeing anything that was not there.  Pt stated he had no thoughts of harming himself or others. Pt stated he would alert staff if anything changes.  Candy Sledge 03/01/2019, 1:15 AM

## 2019-03-01 NOTE — [facility] Group Notes (Signed)
LCSW Group Therapy Note  03/01/2019   11:15am-12:00pm   Type of Therapy and Topic:  Group Therapy: Anger   Participation Level:  Minimal   Description of Group:   In this group, patients identified a recent time they became angry and how they reacted.  They analyzed how their reaction was possibly beneficial and how it was possibly unhelpful.  They learned how to recognize the physical responses they have to anger-provoking situations that can alert them to the possibility that they are becoming angry.  They also discussed how anger often arises from conflict with our value system, which is different from one person to another.  Therapeutic Goals: 1. Patients will remember their last incident of anger and how they felt emotionally and physically. 2. Patients will identify how their behavior at that time worked for them, as well as how it worked against them. 3. Patients will explore the idea that people's value systems differ and can affect anger 4. Patients will learn that anger itself is normal and is "a sign something needs to change."  Summary of Patient Progress:  The patient shared that his most recent time of anger was this morning and said he was pretty angry at something to do with the television.  He said that he used the healthy coping skills of walking away and taking a shower, but also used the unhealthy coping skill of punching "something stupid."  The patient was in and out of the room numerous times and not engaged.  Therapeutic Modalities:   Cognitive Behavioral Therapy  Maretta Los, LCSW

## 2019-03-01 NOTE — [facility] Group Notes (Signed)
[facility] Group Notes:  (Nursing/MHT/Case Management/Adjunct)  Date:  03/01/2019  Time:  0900   Type of Therapy:  Nurse Education  Participation Level:  Active  Participation Quality:  Appropriate  Affect:  Appropriate  Cognitive:  Appropriate  Insight:  Appropriate  Engagement in Group:  Improving  Modes of Intervention:  Activity, Discussion and Education  Summary of Progress/Problems: Discussed anger management.  Karisma Meiser L 03/01/2019, 10:41 AM

## 2019-03-01 NOTE — Progress Notes (Addendum)
Patient ID: SHANT HENCE, male   DOB: [birth year], 33 y.o.   MRN: [id number]  Pt was using the Patient-telephone in the hallway on the unit and reported being shocked by the telephone. Pt made RN aware. Pt stated that he felt a "tiny" shock from his face, down his arm, chest and testicles. RN assessed pt and no injury was noted. MD was notified. Pt stated "Electricity doesn't bother me like other people." RN assessed the telephone and found  the metal attached to the receiver, covering the wires, had slipped down. Three or four wires were exposed, with the white wire being severed/disconnected from the receiver. RN covered the exposed wires and a note was placed on the phone stating that it is out-of-order. The power source/switch to the phone was turned off and facilities at [facility] was notified. Pt's vitals are stable at 132/80-BP and 88-Pulse.

## 2019-03-01 NOTE — Progress Notes (Addendum)
[facility] MD Progress Note  03/01/2019 12:59 PM Zachary Eaton  MRN:  [id number] Subjective:  "I'm fine."  Mr. Zachary Eaton has been active in unit milieu, participating in groups. [facility], and we are still awaiting bed availability at the [facility] for discharge. He is requesting discharge home and becomes frustrated when told he will be transported directly to the [place]. We reviewed that transfer was occurring due to safety concerns related to property destruction at home prior to admission, and need for continuing care. He denies SI/HI/AVH. No signs of responding to internal stimuli. He is med compliant. He reports difficulty sleeping last night related to a new patient on the unit. 5.25 hours of sleep recorded. No agitated or disruptive behaviors on the unit.  From admission H&P: Zachary Eaton was well-known to the service last here in February, he is a 33 year old veteran with a history of multiple traumatic brain injuries, PTSD symptoms, and resultant psychosis.  He fits the criteria for a schizoaffective type condition, bipolar.  Though he stabilized fairly well here on antipsychotic/mood stabilizer therapy was transferred to the [facility] in late February, released approximately 6 days later but was noncompliant with medications.  Principal Problem: <principal problem not specified> Diagnosis: Active Problems:   Psychosis (HCC)  Total Time spent with patient: 15 minutes  Past Psychiatric History: See admission H&P  Past Medical History:  Past Medical History:  Diagnosis Date  . Migraines   . Traumatic brain injury [facility])     Past Surgical History:  Procedure Laterality Date  . ANKLE SURGERY Left    Family History: History reviewed. No pertinent family history. Family Psychiatric  History: See admission H&P Social History:  Social History   Substance and Sexual Activity  Alcohol Use Yes   Comment: occ     Social History   Substance and Sexual Activity  Drug Use  Not Currently    Social History   Socioeconomic History  . Marital status: Single    Spouse name: Not on file  . Number of children: Not on file  . Years of education: Not on file  . Highest education level: Not on file  Occupational History  . Not on file  Social Needs  . Financial resource strain: Not on file  . Food insecurity    Worry: Not on file    Inability: Not on file  . Transportation needs    Medical: Not on file    Non-medical: Not on file  Tobacco Use  . Smoking status: Current Some Day Smoker    Packs/day: 2.00    Types: Cigarettes  . Smokeless tobacco: Current User    Types: Chew  . Tobacco comment: patient declined  Substance and Sexual Activity  . Alcohol use: Yes    Comment: occ  . Drug use: Not Currently  . Sexual activity: Not Currently  Lifestyle  . Physical activity    Days per week: Not on file    Minutes per session: Not on file  . Stress: Not on file  Relationships  . Social Musician on phone: Not on file    Gets together: Not on file    Attends religious service: Not on file    Active member of club or organization: Not on file    Attends meetings of clubs or organizations: Not on file    Relationship status: Not on file  Other Topics Concern  . Not on file  Social History Narrative  .  Not on file   Additional Social History:                         Sleep: Good  Appetite:  Good  Current Medications: Current Facility-Administered Medications  Medication Dose Route Frequency Provider Last Rate Last Dose  . acetaminophen (TYLENOL) tablet 650 mg  650 mg Oral Q6H PRN Denzil Magnusonhomas, Lashunda, NP   650 mg at 02/22/19 0155  . alum & mag hydroxide-simeth (MAALOX/MYLANTA) 200-200-20 MG/5ML suspension 30 mL  30 mL Oral Q4H PRN Denzil Magnusonhomas, Lashunda, NP      . divalproex (DEPAKOTE) DR tablet 250 mg  250 mg Oral Daily Malvin JohnsFarah, Brian, MD   250 mg at 03/01/19 0805  . divalproex (DEPAKOTE) DR tablet 500 mg  500 mg Oral QHS Antonieta Pertlary, Greg  Lawson, MD   500 mg at 02/28/19 2100  . feeding supplement (ENSURE ENLIVE) (ENSURE ENLIVE) liquid 237 mL  237 mL Oral BID BM Malvin JohnsFarah, Brian, MD   237 mL at 03/01/19 1133  . haloperidol lactate (HALDOL) injection 10 mg  10 mg Intramuscular BID PRN Malvin JohnsFarah, Brian, MD   10 mg at 02/19/19 1628  . lithium carbonate capsule 450 mg  450 mg Oral QHS Antonieta Pertlary, Greg Lawson, MD   450 mg at 02/28/19 2100  . OLANZapine zydis (ZYPREXA) disintegrating tablet 10 mg  10 mg Oral Q8H PRN Antonieta Pertlary, Greg Lawson, MD   10 mg at 02/21/19 2344   And  . LORazepam (ATIVAN) tablet 1 mg  1 mg Oral PRN Antonieta Pertlary, Greg Lawson, MD      . magnesium hydroxide (MILK OF MAGNESIA) suspension 30 mL  30 mL Oral Daily PRN Denzil Magnusonhomas, Lashunda, NP      . nicotine polacrilex (NICORETTE) gum 2 mg  2 mg Oral PRN Jearld Leschixon, Rashaun M, NP   2 mg at 03/01/19 0806  . omega-3 acid ethyl esters (LOVAZA) capsule 1 g  1 g Oral BID Denzil Magnusonhomas, Lashunda, NP   1 g at 03/01/19 0805  . paliperidone (INVEGA SUSTENNA) injection 234 mg  234 mg Intramuscular Once Malvin JohnsFarah, Brian, MD      . risperiDONE (RISPERDAL) tablet 6 mg  6 mg Oral QHS Malvin JohnsFarah, Brian, MD   6 mg at 02/28/19 2059  . sodium chloride (OCEAN) 0.65 % nasal spray 1 spray  1 spray Each Nare PRN Nira ConnBerry, Jason A, NP   1 spray at 02/28/19 2244  . temazepam (RESTORIL) capsule 30 mg  30 mg Oral QHS Malvin JohnsFarah, Brian, MD   30 mg at 02/28/19 2059    Lab Results: No results found for this or any previous visit (from the past 48 hour(s)).  Blood Alcohol level:  Lab Results  Component Value Date   ETH <10 02/17/2019   ETH <10 06/11/2018    Metabolic Disorder Labs: Lab Results  Component Value Date   HGBA1C 4.9 02/18/2019   MPG 93.93 02/18/2019   Lab Results  Component Value Date   PROLACTIN 27.8 (H) 02/18/2019   Lab Results  Component Value Date   CHOL 138 02/18/2019   TRIG 71 02/18/2019   HDL 44 02/18/2019   CHOLHDL 3.1 02/18/2019   VLDL 14 02/18/2019   LDLCALC 80 02/18/2019    Physical Findings: AIMS: Facial and  Oral Movements Muscles of Facial Expression: None, normal Lips and Perioral Area: None, normal Jaw: None, normal Tongue: None, normal,Extremity Movements Upper (arms, wrists, hands, fingers): None, normal Lower (legs, knees, ankles, toes): None, normal, Trunk Movements Neck, shoulders, hips:  None, normal, Overall Severity Severity of abnormal movements (highest score from questions above): None, normal Incapacitation due to abnormal movements: None, normal Patient's awareness of abnormal movements (rate only patient's report): No Awareness, Dental Status Current problems with teeth and/or dentures?: No Does patient usually wear dentures?: No  CIWA:  CIWA-Ar Total: 1 COWS:  COWS Total Score: 1  Musculoskeletal: Strength & Muscle Tone: within normal limits Gait & Station: normal Patient leans: N/A  Psychiatric Specialty Exam: Physical Exam  Nursing note and vitals reviewed. Constitutional: He is oriented to person, place, and time. He appears well-developed and well-nourished.  Cardiovascular: Normal rate.  Respiratory: Effort normal.  Neurological: He is alert and oriented to person, place, and time.    Review of Systems  Constitutional: Negative.   Respiratory: Negative for cough and shortness of breath.   Cardiovascular: Negative for chest pain.  Psychiatric/Behavioral: Negative for depression, hallucinations, substance abuse and suicidal ideas. The patient is not nervous/anxious and does not have insomnia.     Blood pressure 124/79, pulse 90, temperature 97.6 F (36.4 C), resp. rate 18, height 5\' 11"  (1.803 m), weight 79.4 kg.Body mass index is 24.41 kg/m.  General Appearance: Casual  Eye Contact:  Good  Speech:  Normal Rate  Volume:  Normal  Mood:  Anxious  Affect:  Appropriate and Congruent  Thought Process:  Coherent  Orientation:  Full (Time, Place, and Person)  Thought Content:  Logical  Suicidal Thoughts:  No  Homicidal Thoughts:  No  Memory:  Immediate;    Good Recent;   Good  Judgement:  Intact  Insight:  Fair  Psychomotor Activity:  Normal  Concentration:  Concentration: Fair and Attention Span: Fair  Recall:  [employer] of Knowledge:  Fair  Language:  Good  Akathisia:  No  Handed:  Right  AIMS (if indicated):     Assets:  Communication Skills Desire for Improvement Financial Resources/Insurance Resilience Social Support  ADL's:  Intact  Cognition:  WNL  Sleep:  Number of Hours: 5.25     Treatment Plan Summary: Daily contact with patient to assess and evaluate symptoms and progress in treatment and Medication management   Continue inpatient hospitalization.  Check labs- VPA, lithium levels, CBC diff, LFTs  Continue Depakote 250 mg PO QAM, 500 mg PO QHS for mood stability Continue lithium 450 mg PO QHS for mood stability Continue Lovaza 1 g PO BID for neuroprotection Continue Risperdal 6 mg PO QHS for psychosis Continue Restoril 30 mg PO QHS for insomnia  Patient will participate in the therapeutic group milieu.  Discharge disposition in progress.   Connye Burkitt, NP 03/01/2019, 12:59 PM   Attest to NP Progress Note

## 2019-03-02 DIAGNOSIS — F3110 Bipolar disorder, current episode manic without psychotic features, unspecified: Secondary | ICD-10-CM | POA: Diagnosis not present

## 2019-03-02 LAB — CBC WITH DIFFERENTIAL/PLATELET
Abs Immature Granulocytes: 0.06 10*3/uL (ref 0.00–0.07)
Basophils Absolute: 0.1 10*3/uL (ref 0.0–0.1)
Basophils Relative: 1 %
Eosinophils Absolute: 0.1 10*3/uL (ref 0.0–0.5)
Eosinophils Relative: 1 %
HCT: 46.1 % (ref 39.0–52.0)
Hemoglobin: 14.6 g/dL (ref 13.0–17.0)
Immature Granulocytes: 1 %
Lymphocytes Relative: 25 %
Lymphs Abs: 2.2 10*3/uL (ref 0.7–4.0)
MCH: 29.6 pg (ref 26.0–34.0)
MCHC: 31.7 g/dL (ref 30.0–36.0)
MCV: 93.3 fL (ref 80.0–100.0)
Monocytes Absolute: 0.8 10*3/uL (ref 0.1–1.0)
Monocytes Relative: 9 %
Neutro Abs: 5.6 10*3/uL (ref 1.7–7.7)
Neutrophils Relative %: 63 %
Platelets: 222 10*3/uL (ref 150–400)
RBC: 4.94 MIL/uL (ref 4.22–5.81)
RDW: 11.9 % (ref 11.5–15.5)
WBC: 8.8 10*3/uL (ref 4.0–10.5)
nRBC: 0 % (ref 0.0–0.2)

## 2019-03-02 LAB — HEPATIC FUNCTION PANEL
ALT: 64 U/L — ABNORMAL HIGH (ref 0–44)
AST: 41 U/L (ref 15–41)
Albumin: 4.4 g/dL (ref 3.5–5.0)
Alkaline Phosphatase: 47 U/L (ref 38–126)
Bilirubin, Direct: <0.1 mg/dL (ref 0.0–0.2)
Total Bilirubin: 0.6 mg/dL (ref 0.3–1.2)
Total Protein: 7.7 g/dL (ref 6.5–8.1)

## 2019-03-02 LAB — VALPROIC ACID LEVEL: Valproic Acid Lvl: 45 ug/mL — ABNORMAL LOW (ref 50.0–100.0)

## 2019-03-02 LAB — LITHIUM LEVEL: Lithium Lvl: 0.36 mmol/L — ABNORMAL LOW (ref 0.60–1.20)

## 2019-03-02 NOTE — Progress Notes (Addendum)
[facility] MD Progress Note  03/02/2019 10:53 AM Zachary Eaton  MRN:  [id number] Subjective:  "I'm fine."  Zachary Eaton found lying in bed. He reports good sleep overnight but fatigue this morning. 6.75 hours of sleep recorded overnight. He presents with minimal speech this morning. He is mildly irritable when awoken but denies SI/HI/AVH. He denied a headache on assessment but a few minutes later stopped this Probation officer in the hallway asking for Fioricet for a headache and was not receptive to Tylenol, ibuprofen being offered.  Patient is still awaiting bed availability for transfer to the [facility] due to acutely agitated behaviors and property destruction prior to admission. Patient has shown no agitated or disruptive behaviors on the unit and compliant with Depakote, lithium, and Risperdal. He reports good mood. Labs reviewed- VPA level 45. Lithium level pending. ALT mildly elevated at 64. AST 41.  From admission H&P: Zachary Eaton was well-known to the service last here in February, he is a 33 year old veteran with a history of multiple traumatic brain injuries, PTSD symptoms, and resultant psychosis. He fits the criteria for a schizoaffective type condition, bipolar. Though he stabilized fairly well here on antipsychotic/mood stabilizer therapy was transferred [place] in late February, released approximately 6 days later but was noncompliant with medications.  Principal Problem: <principal problem not specified> Diagnosis: Active Problems:   Psychosis ([place])  Total Time spent with patient: 15 minutes  Past Psychiatric History: See admission H&P  Past Medical History:  Past Medical History:  Diagnosis Date  . Migraines   . Traumatic brain injury [facility])     Past Surgical History:  Procedure Laterality Date  . ANKLE SURGERY Left    Family History: History reviewed. No pertinent family history. Family Psychiatric  History: See admission H&P Social History:  Social History   Substance  and Sexual Activity  Alcohol Use Yes   Comment: occ     Social History   Substance and Sexual Activity  Drug Use Not Currently    Social History   Socioeconomic History  . Marital status: Single    Spouse name: Not on file  . Number of children: Not on file  . Years of education: Not on file  . Highest education level: Not on file  Occupational History  . Not on file  Social Needs  . Financial resource strain: Not on file  . Food insecurity    Worry: Not on file    Inability: Not on file  . Transportation needs    Medical: Not on file    Non-medical: Not on file  Tobacco Use  . Smoking status: Current Some Day Smoker    Packs/day: 2.00    Types: Cigarettes  . Smokeless tobacco: Current User    Types: Chew  . Tobacco comment: patient declined  Substance and Sexual Activity  . Alcohol use: Yes    Comment: occ  . Drug use: Not Currently  . Sexual activity: Not Currently  Lifestyle  . Physical activity    Days per week: Not on file    Minutes per session: Not on file  . Stress: Not on file  Relationships  . Social Herbalist on phone: Not on file    Gets together: Not on file    Attends religious service: Not on file    Active member of club or organization: Not on file    Attends meetings of clubs or organizations: Not on file    Relationship status: Not on  file  Other Topics Concern  . Not on file  Social History Narrative  . Not on file   Additional Social History:                         Sleep: Good  Appetite:  Good  Current Medications: Current Facility-Administered Medications  Medication Dose Route Frequency Provider Last Rate Last Dose  . acetaminophen (TYLENOL) tablet 650 mg  650 mg Oral Q6H PRN Denzil Magnuson, NP   650 mg at 02/22/19 0155  . alum & mag hydroxide-simeth (MAALOX/MYLANTA) 200-200-20 MG/5ML suspension 30 mL  30 mL Oral Q4H PRN Denzil Magnuson, NP      . divalproex (DEPAKOTE) DR tablet 250 mg  250 mg Oral  Daily Malvin Johns, MD   250 mg at 03/02/19 0853  . divalproex (DEPAKOTE) DR tablet 500 mg  500 mg Oral QHS Antonieta Pert, MD   500 mg at 03/01/19 2109  . feeding supplement (ENSURE ENLIVE) (ENSURE ENLIVE) liquid 237 mL  237 mL Oral BID BM Malvin Johns, MD   237 mL at 03/02/19 1027  . haloperidol lactate (HALDOL) injection 10 mg  10 mg Intramuscular BID PRN Malvin Johns, MD   10 mg at 02/19/19 1628  . lithium carbonate capsule 450 mg  450 mg Oral QHS Antonieta Pert, MD   450 mg at 03/01/19 2110  . OLANZapine zydis (ZYPREXA) disintegrating tablet 10 mg  10 mg Oral Q8H PRN Antonieta Pert, MD   10 mg at 02/21/19 2344   And  . LORazepam (ATIVAN) tablet 1 mg  1 mg Oral PRN Antonieta Pert, MD      . magnesium hydroxide (MILK OF MAGNESIA) suspension 30 mL  30 mL Oral Daily PRN Denzil Magnuson, NP      . nicotine polacrilex (NICORETTE) gum 2 mg  2 mg Oral PRN Jearld Lesch, NP   2 mg at 03/01/19 0806  . omega-3 acid ethyl esters (LOVAZA) capsule 1 g  1 g Oral BID Denzil Magnuson, NP   1 g at 03/02/19 0853  . paliperidone (INVEGA SUSTENNA) injection 234 mg  234 mg Intramuscular Once Malvin Johns, MD      . risperiDONE (RISPERDAL) tablet 6 mg  6 mg Oral QHS Malvin Johns, MD   6 mg at 03/01/19 2109  . sodium chloride (OCEAN) 0.65 % nasal spray 1 spray  1 spray Each Nare PRN Nira Conn A, NP   1 spray at 02/28/19 2244  . temazepam (RESTORIL) capsule 30 mg  30 mg Oral QHS Malvin Johns, MD   30 mg at 03/01/19 2109    Lab Results:  Results for orders placed or performed during the hospital encounter of 02/17/19 (from the past 48 hour(s))  Valproic acid level     Status: Abnormal   Collection Time: 03/02/19  6:41 AM  Result Value Ref Range   Valproic Acid Lvl 45 (L) 50.0 - 100.0 ug/mL    Comment: Performed at [facility], [address] [place], [place] [address]  CBC with Differential/Platelet     Status: None   Collection Time: 03/02/19  6:41 AM  Result Value  Ref Range   WBC 8.8 4.0 - 10.5 K/uL   RBC 4.94 4.22 - 5.81 MIL/uL   Hemoglobin 14.6 13.0 - 17.0 g/dL   HCT 71.0 62.6 - 94.8 %   MCV 93.3 80.0 - 100.0 fL   MCH 29.6 26.0 - 34.0 pg  MCHC 31.7 30.0 - 36.0 g/dL   RDW 16.111.9 09.611.5 - 04.515.5 %   Platelets 222 150 - 400 K/uL   nRBC 0.0 0.0 - 0.2 %   Neutrophils Relative % 63 %   Neutro Abs 5.6 1.7 - 7.7 K/uL   Lymphocytes Relative 25 %   Lymphs Abs 2.2 0.7 - 4.0 K/uL   Monocytes Relative 9 %   Monocytes Absolute 0.8 0.1 - 1.0 K/uL   Eosinophils Relative 1 %   Eosinophils Absolute 0.1 0.0 - 0.5 K/uL   Basophils Relative 1 %   Basophils Absolute 0.1 0.0 - 0.1 K/uL   Immature Granulocytes 1 %   Abs Immature Granulocytes 0.06 0.00 - 0.07 K/uL    Comment: Performed at [facility], [address] [place], [place] [address]  Hepatic function panel     Status: Abnormal   Collection Time: 03/02/19  6:41 AM  Result Value Ref Range   Total Protein 7.7 6.5 - 8.1 g/dL   Albumin 4.4 3.5 - 5.0 g/dL   AST 41 15 - 41 U/L   ALT 64 (H) 0 - 44 U/L   Alkaline Phosphatase 47 38 - 126 U/L   Total Bilirubin 0.6 0.3 - 1.2 mg/dL   Bilirubin, Direct <1.9<0.1 0.0 - 0.2 mg/dL   Indirect Bilirubin NOT CALCULATED 0.3 - 0.9 mg/dL    Comment: Performed at [facility], [address] [place], [place] [address]    Blood Alcohol level:  Lab Results  Component Value Date   [facility] 02/17[facility]   ETH <10 06/11/2018    Metabolic Disorder Labs: Lab Results  Component Value Date   HGBA1C 4.9 02/18/2019   MPG 93.93 02/18/2019   Lab Results  Component Value Date   PROLACTIN 27.8 (H) 02/18/2019   Lab Results  Component Value Date   CHOL 138 02/18/2019   TRIG 71 02/18/2019   HDL 44 02/18/2019   CHOLHDL 3.1 02/18/2019   VLDL 14 02/18/2019   LDLCALC 80 02/18/2019    Physical Findings: AIMS: Facial and Oral Movements Muscles of Facial Expression: None, normal Lips and Perioral Area: None, normal Jaw: None, normal Tongue:  None, normal,Extremity Movements Upper (arms, wrists, hands, fingers): None, normal Lower (legs, knees, ankles, toes): None, normal, Trunk Movements Neck, shoulders, hips: None, normal, Overall Severity Severity of abnormal movements (highest score from questions above): None, normal Incapacitation due to abnormal movements: None, normal Patient's awareness of abnormal movements (rate only patient's report): No Awareness, Dental Status Current problems with teeth and/or dentures?: No Does patient usually wear dentures?: No  CIWA:  CIWA-Ar Total: 1 COWS:  COWS Total Score: 1  Musculoskeletal: Strength & Muscle Tone: within normal limits Gait & Station: normal Patient leans: N/A  Psychiatric Specialty Exam: Physical Exam  Nursing note and vitals reviewed. Constitutional: He is oriented to person, place, and time. He appears well-developed and well-nourished.  Cardiovascular: Normal rate.  Respiratory: Effort normal.  Musculoskeletal: Normal range of motion.  Neurological: He is alert and oriented to person, place, and time.    Review of Systems  Constitutional: Negative.   Respiratory: Negative for cough and shortness of breath.   Cardiovascular: Negative for chest pain.  Psychiatric/Behavioral: Negative for depression, hallucinations, substance abuse and suicidal ideas. The patient is not nervous/anxious and does not have insomnia.     Blood pressure 130/74, pulse 93, temperature 97.6 F (36.4 C), resp. rate 18, height 5\' 11"  (1.803 m), weight 79.4 kg.Body mass index is 24.41  kg/m.  General Appearance: Disheveled  Eye Contact:  Fair  Speech:  Normal Rate  Volume:  Normal  Mood:  Euthymic  Affect:  Constricted  Thought Process:  Coherent  Orientation:  Full (Time, Place, and Person)  Thought Content:  Logical  Suicidal Thoughts:  No  Homicidal Thoughts:  No  Memory:  Immediate;   Fair Recent;   Fair  Judgement:  Fair  Insight:  Fair  Psychomotor Activity:  Normal   Concentration:  Concentration: Fair and Attention Span: Fair  Recall:  [employer] of Knowledge:  Fair  Language:  Fair  Akathisia:  No  Handed:  Right  AIMS (if indicated):     Assets:  Communication Skills Desire for Improvement Financial Resources/Insurance Resilience  ADL's:  Intact  Cognition:  WNL  Sleep:  Number of Hours: 6.75     Treatment Plan Summary: Daily contact with patient to assess and evaluate symptoms and progress in treatment and Medication management   Continue inpatient hospitalization.  Continue Depakote 250 mg PO QAM, 500 mg PO QHS for mood stability Continue lithium 450 mg PO QHS for mood stability Continue Lovaza 1 g PO BID for neuroprotection Continue Risperdal 6 mg PO QHS for psychosis Continue Restoril 30 mg PO QHS for insomnia  Patient will participate in the therapeutic group milieu.  Discharge disposition in progress.   Aldean Baker, NP 03/02/2019, 10:53 AM   Attest to NP Progress Note

## 2019-03-02 NOTE — Progress Notes (Signed)
Patient up in the dayroom with peers. He reports having had a good day and was able to go outside.he was compliant with meds. He reports hopeful to discharge home before going to treatment. He is not sure if doctor will allow him to go home first. Writer encouraged him to speak with his doctor concerning this matter. Safety maintained with 15 min checks.

## 2019-03-02 NOTE — Progress Notes (Signed)
Adult Psychoeducational Group Note  Date:  03/02/2019 Time:  11:52 PM  Group Topic/Focus:  Wrap-Up Group:   The focus of this group is to help patients review their daily goal of treatment and discuss progress on daily workbooks.  Participation Level:  Active  Participation Quality:  Appropriate  Affect:  Appropriate  Cognitive:  Appropriate  Insight: Appropriate  Engagement in Group:  Developing/Improving  Modes of Intervention:  Discussion  Additional Comments: Pt stated his goal for today was to focus on his treatment plan. Pt stated he felt he accomplished his goal today.   Pt stated his relationship with his family has improved since he was admitted here. Pt stated been able to contact his parents and his ex-wife today help improve his day.  Pt stated he felt better about himself today. Pt rated his overall day a 7 out of 10. Pt stated his appetite was pretty good today. Pt stated his goal for tonight was to get some rest. Pt did complain of having a headace tonight. Pt' nurse was made aware of the situation. Pt stated he was not hearing or seeing anything that was not there.  Pt stated he had no thoughts of harming himself or others. Pt stated he would alert staff if anything changes.   Candy Sledge 03/02/2019, 11:52 PM

## 2019-03-02 NOTE — Progress Notes (Signed)
   03/02/19 0800  Psych Admission Type (Psych Patients Only)  Admission Status Involuntary  Psychosocial Assessment  Patient Complaints None  Eye Contact Fair  Facial Expression Anxious  Affect Appropriate to circumstance  Speech Logical/coherent  Interaction Assertive  Motor Activity Fidgety  Appearance/Hygiene Unremarkable  Behavior Characteristics Appropriate to situation  Mood Anxious  Aggressive Behavior  Effect No apparent injury  Thought Process  Coherency WDL  Content WDL  Delusions None reported or observed  Perception WDL  Hallucination None reported or observed  Judgment Impaired  Confusion Mild  Danger to Self  Current suicidal ideation? Denies  Danger to Others  Danger to Others None reported or observed

## 2019-03-02 NOTE — B[facility] Counselor (Signed)
Clinical Social Work Note  Pharmacologist [facility] AOD at [phone] ext. [phone] to check on bed availability again.  No beds are available today.  CSW team to continue following.  Selmer Dominion, LCSW 03/02/2019, 8:30 AM

## 2019-03-02 NOTE — Progress Notes (Signed)
Adult Psychoeducational Group Note  Date:  03/02/2019 Time:  2:17 AM  Group Topic/Focus:  Wrap-Up Group:   The focus of this group is to help patients review their daily goal of treatment and discuss progress on daily workbooks.  Participation Level:  Active  Participation Quality:  Appropriate  Affect:  Appropriate  Cognitive:  Appropriate  Insight: Appropriate  Engagement in Group:  Developing/Improving  Modes of Intervention:  Discussion  Additional Comments:   Pt stated his goal for today was to talk with his doctor about his discharge plan. Pt stated he talk with with the doctor and his discharge is set up for Monday 03/03/2019. Pt stated his relationship with his family had improved since he was admitted here. Pt stated talking to his parents and peers  today help improve his day.  Pt stated he felt better about himself today. Pt rated his overall day a 5 out of 10. Pt stated his appetite was improved today. Pt stated his goal for tonight is to get some rest. Pt did not complain of any pain tonight. Pt stated he was not hearing voices or seeing anything that was not there.  Pt stated he had no thoughts of harming himself or others. Pt stated he would alert staff if anything changes.   Candy Sledge 03/02/2019, 2:17 AM

## 2019-03-02 NOTE — Progress Notes (Signed)
Pt c/o of a migraine and requested Firocet. Pt stated that he takes Fioricet for Migraines that are prescribed by the [place]. Pt discussed complaint with Marcie Bal, NP., and the NP recommended the pt take Tylenol. Writer offered the pt Tylenol and he declined.

## 2019-03-02 NOTE — BHH Group Notes (Signed)
[facility] LCSW Group Therapy Note  Date/Time:  03/02/2019  11:00AM-12:00PM  Type of Therapy and Topic:  Group Therapy:  Music and Mood  Participation Level:  Active   Description of Group: In this process group, members listened to a variety of genres of music and identified that different types of music evoke different responses.  Patients were encouraged to identify music that was soothing for them and music that was energizing for them.  Patients discussed how this knowledge can help with wellness and recovery in various ways including managing depression and anxiety as well as encouraging healthy sleep habits.    Therapeutic Goals: 1. Patients will explore the impact of different varieties of music on mood 2. Patients will verbalize the thoughts they have when listening to different types of music 3. Patients will identify music that is soothing to them as well as music that is energizing to them 4. Patients will discuss how to use this knowledge to assist in maintaining wellness and recovery 5. Patients will explore the use of music as a coping skill  Summary of Patient Progress:  At the beginning of group, patient expressed that he had a headache.  At the end of group, patient expressed that his headache was reduced and he felt "unjumbled."  He participated fully throughout group.    Therapeutic Modalities: Solution Focused Brief Therapy Activity   Selmer Dominion, LCSW

## 2019-03-03 DIAGNOSIS — F250 Schizoaffective disorder, bipolar type: Secondary | ICD-10-CM | POA: Diagnosis not present

## 2019-03-03 LAB — HEPATIC FUNCTION PANEL
ALT: 59 U/L — ABNORMAL HIGH (ref 0–44)
AST: 37 U/L (ref 15–41)
Albumin: 4.6 g/dL (ref 3.5–5.0)
Alkaline Phosphatase: 46 U/L (ref 38–126)
Bilirubin, Direct: 0.1 mg/dL (ref 0.0–0.2)
Indirect Bilirubin: 0.1 mg/dL — ABNORMAL LOW (ref 0.3–0.9)
Total Bilirubin: 0.2 mg/dL — ABNORMAL LOW (ref 0.3–1.2)
Total Protein: 7.7 g/dL (ref 6.5–8.1)

## 2019-03-03 NOTE — Progress Notes (Signed)
D: Pt denies SI/HI/AVH. Pt is pleasant and cooperative. Pt stated he thought he was going  To leave today, but wa hopeful for D/C tomorrow.  A: Pt was offered support and encouragement. Pt was given scheduled medications. Pt was encourage to attend groups. Q 15 minute checks were done for safety.   R:Pt attends groups and interacts well with peers and staff. Pt is taking medication. Pt has no complaints.Pt receptive to treatment and safety maintained on unit.

## 2019-03-03 NOTE — Progress Notes (Signed)
Patient rated his day as a 7 out of a possible 10 since his discharge was cancelled. His goal for tomorrow is to get discharged. He states that he has a court hearing tomorrow at 2pm and has been assigned to a Nurse, adult.

## 2019-03-03 NOTE — Progress Notes (Signed)
Patient ID: AZLAAN ISIDORE, male   DOB: 21-Jun-1985, 33 y.o.   MRN: [id number]   CSW was contacted by the pt's social worker at the [place] in [place]. Pt's social worker wanted an update. Pt's social worker stated that she talked to the pt's mother this morning and the pt's mother told her that she does not want the patient back at home. She wants the patient to stay at the hospital until placement at the [facility]. CSW explained that there are zero beds available right now at the [place] and that the doctor is going to reach out to the pt's mother today to confirm.    Ardelle Anton, MSW, [facility] Phone: [phone] Fax: [phone]

## 2019-03-03 NOTE — BHH Group Notes (Signed)
[facility] LCSW Group Therapy Note   Date and Time: 03/03/2019 @ 1:30pm  Type of Therapy and Topic:  Group Therapy:  Trust and Honesty    Participation Level:  BHH PARTICIPATION LEVEL: Active   Mood: Pleasant    Description of Group:     In this group patients will be asked to explore the value of being honest.  Patients will be guided to discuss their thoughts, feelings, and behaviors related to honesty and trusting in others. Patients will process together how trust and honesty relate to forming relationships with peers, family members, and self. Each patient will be challenged to identify and express feelings of being vulnerable. Patients will discuss reasons why people are dishonest and identify alternative outcomes if one was truthful (to self or others). This group will be process-oriented, with patients participating in exploration of their own experiences, giving and receiving support, and processing challenge from other group members.     Therapeutic Goals:  1.  Patient will identify why honesty is important to relationships and how honesty overall affects relationships.  2.  Patient will identify a situation where they lied or were lied too and the  feelings, thought process, and behaviors surrounding the situation  3.  Patient will identify the meaning of being vulnerable, how that feels, and how that correlates to being honest with self and others.  4.  Patient will identify situations where they could have told the truth, but instead lied and explain reasons of dishonesty.     Summary of Patient Progress:       Patient was active and engaged in group therapy today. Patient was able to identify why honesty is important to relationships which is that nothing is hidden when you are honest in a relationship. Patient stated that he feels terrible and angry when he is lied to by others. Patient stated that he tends to just walk away. Patient opened up about some friends not  being honest. Patient stated that reasons why people are often dishonest is because they are trying to protect others. Patient discussed that he often will just not talk if he feels that he will lie. Patient stated that when he discharges that he will try to keep an open line with friends.         Therapeutic Modalities:    Cognitive Behavioral Therapy  Solution Focused Therapy  Motivational Interviewing  Brief Therapy   Ardelle Anton, MSW, LCSW
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
## 2019-03-03 NOTE — Progress Notes (Signed)
Patient ID: TAEVON ASCHOFF, male   DOB: 07-16-1985, 33 y.o.   MRN: [id number]  CSW contacted April at the [facility] about bed availability. April stated that there is no bed availably today but to call back tomorrow.    Ardelle Anton, MSW, [facility] Phone: [phone] Fax: [phone]

## 2019-03-03 NOTE — Progress Notes (Signed)
Recreation Therapy Notes  Date: 10.19.20 Time: 1000 Location: 500 Hall Dayroom  Group Topic: Coping Skills  Goal Area(s) Addresses:  Patient will identify positive coping skills. Patient will identify benefit of using coping skills post d/c.  Behavioral Response: Engaged  Intervention: [employer], dry erase marker, worksheet, pencils  Activity: Mind Map.  Patients were given a blank mind map.  LRT and patients filled in the first 8 boxes together (arguments, road rage, solicitors, family, work, finances, weather and relationships). Patients were given time to come up with at least 3 coping skills for the areas identified.  LRT would then write the coping skills on the board so patients could fill in any blank spaces they may have had on their mind map.    Education: Radiographer, therapeutic, Dentist.   Education Outcome: Acknowledges understanding/In group clarification offered/Needs additional education.   Clinical Observations/Feedback: Pt was engaged and social during group session.  Pt identified coping skills as think before speaking, walking away; pull over, music, friends; don't answer, hang up, be patient; distance, talk with them, be present; duty, fulfillment/enjoyment, be professional; earn, save, budget; jacket, umbrella, swimsuit; love, patience and fix it and move on.    Zachary Eaton, LRT/CTRS    Zachary Eaton A 03/03/2019 11:27 AM

## 2019-03-03 NOTE — Progress Notes (Signed)
Patient ID: Zachary Eaton, male   DOB: 09/11/1985, 33 y.o.   MRN: [id number]   CSW obtained a copy of the patient's updated IVC paperwork from the patient.   CSW faxed a copy over to the [place] in [place] for the patient, per their request.

## 2019-03-04 DIAGNOSIS — F250 Schizoaffective disorder, bipolar type: Secondary | ICD-10-CM | POA: Diagnosis not present

## 2019-03-04 NOTE — Progress Notes (Signed)
Patient ID: Zachary Eaton, male   DOB: 1985-10-08, 33 y.o.   MRN: [id number]   CSW spoke with Claiborne Billings from the [place] Patient Transport Coordination at the [place] in [place]. Claiborne Billings stated that there are still not any beds at his time. Claiborne Billings stated that if the patient is discharged from the hospital then he wouldn't be able to continue with going to the [facility] inpatient units. They only do hospital to [facility] transports. Claiborne Billings also stated that if he is within 48 hours of discharge that they will not take him. Claiborne Billings stated that she will call [facility] every day with updates.

## 2019-03-04 NOTE — Progress Notes (Signed)
D: Pt stated he was ok, but pt appears to be getting anxious about being here so long, pt focused on D/C A: Pt was offered support and encouragement. Pt was given scheduled medications. Pt was encourage to attend groups. Q 15 minute checks were done for safety.  R:Pt attends groups and interacts well with peers and staff. Pt is taking medication. Pt has no complaints.Pt receptive to treatment and safety maintained on unit.

## 2019-03-04 NOTE — Progress Notes (Signed)
Patient ID: Zachary Eaton, male   DOB: [birth year], 33 y.o.   MRN: [id number]  CSW contacted the pt's mother to discuss her wants for a meeting regarding the possibility of the patient being discharged. CSW explained that the CSW will meet with the doctor during the progression meeting and discuss having a treatment team meeting with the patient, his parents, his [place] Education officer, museum, and [facility] staff/doctor. Pt's mother stated that she was told that the patient does not meet for extended IVC and he has a court date today for the IVC at 2pm.  CSW explained to the pt's mother that CSW will get back in touch with her after the meeting.

## 2019-03-04 NOTE — Progress Notes (Signed)
Patient ID: Zachary Eaton, male   DOB: 08/27/1985, 33 y.o.   MRN: [id number]  CSW went to talk with the patient to update him. Patient got upset and stated that he wants to go home and he doesn't even have to go home with his parents. Patient closed the door in the CSW's face.   Ardelle Anton, MSW, [facility] Phone: [phone] Fax: [phone]

## 2019-03-04 NOTE — Progress Notes (Signed)
Recreation Therapy Notes  Date: 10.20.20 Time: 0955 Location: 500 Hall Dayroom  Group Topic: Triggers  Goal Area(s) Addresses:  Patient will identify triggers. Patient will identify how to avoid triggers. Patient will identify how to deal with triggers head on.  Behavioral Response: None  Intervention: Worksheet, music  Activity: Triggers.  Patients were to identify their biggest triggers.  Patients would then identify ways to avoid/reduce exposure to triggers and how to face triggers head on.  Education: Triggers, Discharge Planning  Education Outcome: Acknowledges understanding/In group clarification offered/Needs additional education.   Clinical Observations/Feedback: Pt came in late.  Pt did not want to complete the worksheet stating he was feeling agitated.  Pt say quietly and listened to the music.    Zachary Eaton, LRT/CTRS         Zachary Eaton A 03/04/2019 11:42 AM

## 2019-03-04 NOTE — Progress Notes (Signed)
The coping skills that patient shared in group is that he can sleep and "walk away". His goal for tomorrow is to get discharged.

## 2019-03-04 NOTE — Progress Notes (Signed)
Patient ID: Zachary Eaton, male   DOB: [birth year], 33 y.o.   MRN: [id number]   CSW contacted the pt's mother back regarding updates from the doctor and the [place]. Pt's mother stated that she prefers the patient to stay at the hospital until he can go to the [place]. Pt's mother stated that is the best care for him at this time and that she does not need a meeting with all parties if the patient is not going to be discharged. CSW informed pt's mother that CSW will let the patient know and will keep all parties updated.  CSW left a voicemail for the patient's social worker through the [place] with all updated information.

## 2019-03-04 NOTE — Progress Notes (Signed)
[facility] MD Progress Note  03/04/2019 9:44 AM Zachary Eaton  MRN:  [id number] Subjective:   Patient remains compliant with meds is partially responsive, focused on discharge, somewhat paranoid believes both his neighbors and parents are "dangerous" would not elaborate  No EPS or TD.  Discussed prognosis.  Plans at team involved possible family meeting prior to discharge if no bed forthcoming at [facility] Principal Problem: Schizoaffective presentation Diagnosis: Active Problems:   Psychosis (HCC)  Total Time spent with patient: 20 minutes  Past Psychiatric History: See last admission note  Past Medical History:  Past Medical History:  Diagnosis Date  . Migraines   . Traumatic brain injury [facility])     Past Surgical History:  Procedure Laterality Date  . ANKLE SURGERY Left    Family History: History reviewed. No pertinent family history. Family Psychiatric  History: No new data Social History:  Social History   Substance and Sexual Activity  Alcohol Use Yes   Comment: occ     Social History   Substance and Sexual Activity  Drug Use Not Currently    Social History   Socioeconomic History  . Marital status: Single    Spouse name: Not on file  . Number of children: Not on file  . Years of education: Not on file  . Highest education level: Not on file  Occupational History  . Not on file  Social Needs  . Financial resource strain: Not on file  . Food insecurity    Worry: Not on file    Inability: Not on file  . Transportation needs    Medical: Not on file    Non-medical: Not on file  Tobacco Use  . Smoking status: Current Some Day Smoker    Packs/day: 2.00    Types: Cigarettes  . Smokeless tobacco: Current User    Types: Chew  . Tobacco comment: patient declined  Substance and Sexual Activity  . Alcohol use: Yes    Comment: occ  . Drug use: Not Currently  . Sexual activity: Not Currently  Lifestyle  . Physical activity    Days per week: Not on  file    Minutes per session: Not on file  . Stress: Not on file  Relationships  . Social Musician on phone: Not on file    Gets together: Not on file    Attends religious service: Not on file    Active member of club or organization: Not on file    Attends meetings of clubs or organizations: Not on file    Relationship status: Not on file  Other Topics Concern  . Not on file  Social History Narrative  . Not on file   Additional Social History:                         Sleep: Good  Appetite:  Good  Current Medications: Current Facility-Administered Medications  Medication Dose Route Frequency Provider Last Rate Last Dose  . acetaminophen (TYLENOL) tablet 650 mg  650 mg Oral Q6H PRN Denzil Magnuson, NP   650 mg at 02/22/19 0155  . alum & mag hydroxide-simeth (MAALOX/MYLANTA) 200-200-20 MG/5ML suspension 30 mL  30 mL Oral Q4H PRN Denzil Magnuson, NP      . divalproex (DEPAKOTE) DR tablet 250 mg  250 mg Oral Daily Malvin Johns, MD   250 mg at 03/03/19 0752  . divalproex (DEPAKOTE) DR tablet 500 mg  500 mg Oral  QHS Antonieta Pertlary, Greg Lawson, MD   500 mg at 03/03/19 2039  . feeding supplement (ENSURE ENLIVE) (ENSURE ENLIVE) liquid 237 mL  237 mL Oral BID BM Malvin JohnsFarah, Sonia Bromell, MD   237 mL at 03/03/19 1429  . haloperidol lactate (HALDOL) injection 10 mg  10 mg Intramuscular BID PRN Malvin JohnsFarah, Sherryl Valido, MD   10 mg at 02/19/19 1628  . lithium carbonate capsule 450 mg  450 mg Oral QHS Antonieta Pertlary, Greg Lawson, MD   450 mg at 03/03/19 2039  . OLANZapine zydis (ZYPREXA) disintegrating tablet 10 mg  10 mg Oral Q8H PRN Antonieta Pertlary, Greg Lawson, MD   10 mg at 02/21/19 2344   And  . LORazepam (ATIVAN) tablet 1 mg  1 mg Oral PRN Antonieta Pertlary, Greg Lawson, MD      . magnesium hydroxide (MILK OF MAGNESIA) suspension 30 mL  30 mL Oral Daily PRN Denzil Magnusonhomas, Lashunda, NP      . nicotine polacrilex (NICORETTE) gum 2 mg  2 mg Oral PRN Jearld Leschixon, Rashaun M, NP   2 mg at 03/03/19 2040  . omega-3 acid ethyl esters (LOVAZA)  capsule 1 g  1 g Oral BID Denzil Magnusonhomas, Lashunda, NP   1 g at 03/03/19 1617  . paliperidone (INVEGA SUSTENNA) injection 234 mg  234 mg Intramuscular Once Malvin JohnsFarah, Lashon Hillier, MD      . risperiDONE (RISPERDAL) tablet 6 mg  6 mg Oral QHS Malvin JohnsFarah, Mao Lockner, MD   6 mg at 03/03/19 2039  . sodium chloride (OCEAN) 0.65 % nasal spray 1 spray  1 spray Each Nare PRN Nira ConnBerry, Jason A, NP   1 spray at 02/28/19 2244  . temazepam (RESTORIL) capsule 30 mg  30 mg Oral QHS Malvin JohnsFarah, Jenah Vanasten, MD   30 mg at 03/03/19 2039    Lab Results:  Results for orders placed or performed during the hospital encounter of 02/17/19 (from the past 48 hour(s))  Hepatic function panel     Status: Abnormal   Collection Time: 03/03/19  6:23 PM  Result Value Ref Range   Total Protein 7.7 6.5 - 8.1 g/dL   Albumin 4.6 3.5 - 5.0 g/dL   AST 37 15 - 41 U/L   ALT 59 (H) 0 - 44 U/L   Alkaline Phosphatase 46 38 - 126 U/L   Total Bilirubin 0.2 (L) 0.3 - 1.2 mg/dL   Bilirubin, Direct 0.1 0.0 - 0.2 mg/dL   Indirect Bilirubin 0.1 (L) 0.3 - 0.9 mg/dL    Comment: Performed at [facility], [address] [place], [place] [address]    Blood Alcohol level:  Lab Results  Component Value Date   [facility]   ETH <10 06/11/2018    Metabolic Disorder Labs: Lab Results  Component Value Date   HGBA1C 4.9 02/18/2019   MPG 93.93 02/18/2019   Lab Results  Component Value Date   PROLACTIN 27.8 (H) 02/18/2019   Lab Results  Component Value Date   CHOL 138 02/18/2019   TRIG 71 02/18/2019   HDL 44 02/18/2019   CHOLHDL 3.1 02/18/2019   VLDL 14 02/18/2019   LDLCALC 80 02/18/2019    Physical Findings: AIMS: Facial and Oral Movements Muscles of Facial Expression: None, normal Lips and Perioral Area: None, normal Jaw: None, normal Tongue: None, normal,Extremity Movements Upper (arms, wrists, hands, fingers): None, normal Lower (legs, knees, ankles, toes): None, normal, Trunk Movements Neck, shoulders, hips: None, normal, Overall  Severity Severity of abnormal movements (highest score from questions above): None, normal Incapacitation due to abnormal  movements: None, normal Patient's awareness of abnormal movements (rate only patient's report): No Awareness, Dental Status Current problems with teeth and/or dentures?: No Does patient usually wear dentures?: No  CIWA:  CIWA-Ar Total: 1 COWS:  COWS Total Score: 1  Musculoskeletal: Strength & Muscle Tone: within normal limits Gait & Station: normal Patient leans: N/A  Psychiatric Specialty Exam: Physical Exam  ROS  Blood pressure (!) 110/56, pulse (!) 120, temperature 97.9 F (36.6 C), temperature source Oral, resp. rate 18, height 5\' 11"  (1.803 m), weight 79.4 kg.Body mass index is 24.41 kg/m.  General Appearance: Casual  Eye Contact:  Good  Speech:  Clear and Coherent  Volume:  Decreased  Mood:  Dysphoric  Affect:  Blunt  Thought Process:  Linear and Descriptions of Associations: Circumstantial  Orientation:  Full (Time, Place, and Person)  Thought Content:  Delusions  Suicidal Thoughts:  No  Homicidal Thoughts:  No  Memory:  Immediate;   Fair Recent;   Fair Remote;   Fair  Judgement:  Fair  Insight:  Shallow  Psychomotor Activity:  Normal  Concentration:  Concentration: Fair and Attention Span: Fair  Recall:  [employer] of Knowledge:  Fair  Language:  Fair  Akathisia:  Negative  Handed:  Right  AIMS (if indicated):     Assets:  Communication Skills Desire for Improvement  ADL's:  Intact  Cognition:  WNL  Sleep:  Number of Hours: 7     Treatment Plan Summary: Daily contact with patient to assess and evaluate symptoms and progress in treatment and Medication management  Continue cognitive therapy continue reality based therapy no change in meds again possible discharge to home prior to [place] program await social worker information  Zachary Hai, MD 03/04/2019, 9:44 AM

## 2019-03-05 DIAGNOSIS — F250 Schizoaffective disorder, bipolar type: Secondary | ICD-10-CM | POA: Diagnosis not present

## 2019-03-05 NOTE — BHH Group Notes (Signed)
Occupational Therapy Group Note  Date:  03/05/2019 Time:  3:10 PM  Group Topic/Focus:  Relaxation Group  Participation Level:  Active  Participation Quality:  Attentive  Affect:  Flat  Cognitive:  Alert  Insight: Lacking  Engagement in Group:  Engaged  Modes of Intervention:  Activity, Discussion, Education and Socialization  Additional Comments:    S: If I listen to relaxation music I can run many miles and feel relaxed  O: Progressive muscle relaxation group completed to facilitate relaxation response and grow coping skills. Pt to follow PMR script and then share what songs make them feel relaxed for the group to listen to.  A: Pt presents to group with flat affect, slightly irritable and wanting to make a phone call despite rules of group with phones being off at this time. He was able to be redirected, engaged in relaxation exercise. He then shares relaxation songs and ways he calms his mind and body.  P: OT group will be x1 per week while pt inpatient.   Zachary Eaton, MSOT, OTR/L Behavioral Health OT/ Acute Relief OT PHP Office: [phone] [facility] Office: [phone]  Zachary Eaton 03/05/2019, 3:10 PM

## 2019-03-05 NOTE — Progress Notes (Signed)
D: Pt stated he was fine. Pt visible on the unit, pt appeared a little sad this evening.  A: Pt was offered support and encouragement. Pt was given scheduled medications. Pt was encourage to attend groups. Q 15 minute checks were done for safety.  R:Pt attends groups and interacts well with peers and staff. Pt is taking medication. Pt has no complaints.Pt receptive to treatment and safety maintained on unit.

## 2019-03-05 NOTE — Progress Notes (Signed)
Patient ID: Zachary Eaton, male   DOB: 10-Nov-1985, 33 y.o.   MRN: [id number]   CSW attempted to call Zachary Eaton with the [place] Patient Transport Coordination at [phone] ext. [name] contacted pt's mother, Zachary Eaton [phone]) to explain that the doctor is looking to discharge the patient home or to the [place], regardless, on Friday. CSW asked for a treatment team with the entire family and staff. Pt's mother agreed to Friday, October 23rd at [place] contacted pt's [place] social worker, Zachary Eaton [phone]) ext. [phone]. Zachary Eaton stated that she can make the meeting on 10/23 at 11am. She gave the CSW her email: [email or web address] to email right before the meeting so that she can call in because she will be working from home.

## 2019-03-05 NOTE — Tx Team (Signed)
Interdisciplinary Treatment and Diagnostic Plan Update  03/05/2019 Time of Session: 1:15pm Zachary Eaton MRN: [id number]  Principal Diagnosis: <principal problem not specified>  Secondary Diagnoses: Active Problems:   Psychosis (HCC)   Current Medications:  Current Facility-Administered Medications  Medication Dose Route Frequency Provider Last Rate Last Dose  . acetaminophen (TYLENOL) tablet 650 mg  650 mg Oral Q6H PRN Denzil Magnuson, NP   650 mg at 02/22/19 0155  . alum & mag hydroxide-simeth (MAALOX/MYLANTA) 200-200-20 MG/5ML suspension 30 mL  30 mL Oral Q4H PRN Denzil Magnuson, NP      . divalproex (DEPAKOTE) DR tablet 250 mg  250 mg Oral Daily Malvin Johns, MD   250 mg at 03/05/19 0817  . divalproex (DEPAKOTE) DR tablet 500 mg  500 mg Oral QHS Antonieta Pert, MD   500 mg at 03/04/19 2130  . feeding supplement (ENSURE ENLIVE) (ENSURE ENLIVE) liquid 237 mL  237 mL Oral BID BM Malvin Johns, MD   237 mL at 03/03/19 1429  . haloperidol lactate (HALDOL) injection 10 mg  10 mg Intramuscular BID PRN Malvin Johns, MD   10 mg at 02/19/19 1628  . lithium carbonate capsule 450 mg  450 mg Oral QHS Antonieta Pert, MD   450 mg at 03/04/19 2130  . OLANZapine zydis (ZYPREXA) disintegrating tablet 10 mg  10 mg Oral Q8H PRN Antonieta Pert, MD   10 mg at 02/21/19 2344   And  . LORazepam (ATIVAN) tablet 1 mg  1 mg Oral PRN Antonieta Pert, MD      . magnesium hydroxide (MILK OF MAGNESIA) suspension 30 mL  30 mL Oral Daily PRN Denzil Magnuson, NP      . nicotine polacrilex (NICORETTE) gum 2 mg  2 mg Oral PRN Jearld Lesch, NP   2 mg at 03/03/19 2040  . omega-3 acid ethyl esters (LOVAZA) capsule 1 g  1 g Oral BID Denzil Magnuson, NP   1 g at 03/05/19 0817  . paliperidone (INVEGA SUSTENNA) injection 234 mg  234 mg Intramuscular Once Malvin Johns, MD      . risperiDONE (RISPERDAL) tablet 6 mg  6 mg Oral QHS Malvin Johns, MD   6 mg at 03/04/19 2130  . sodium chloride (OCEAN)  0.65 % nasal spray 1 spray  1 spray Each Nare PRN Nira Conn A, NP   1 spray at 02/28/19 2244  . temazepam (RESTORIL) capsule 30 mg  30 mg Oral QHS Malvin Johns, MD   30 mg at 03/04/19 2130   PTA Medications: Medications Prior to Admission  Medication Sig Dispense Refill Last Dose  . divalproex (DEPAKOTE SPRINKLE) 125 MG capsule Take 2 capsules (250 mg total) by mouth every 12 (twelve) hours. (Patient not taking: Reported on 02/18/2019) 60 capsule 1 Not Taking at Unknown time  . lithium carbonate 150 MG capsule Take 1 capsule (150 mg total) by mouth at bedtime. (Patient not taking: Reported on 02/18/2019) 60 capsule 1 Not Taking at Unknown time  . omega-3 acid ethyl esters (LOVAZA) 1 g capsule Take 1 capsule (1 g total) by mouth 2 (two) times daily. (Patient not taking: Reported on 02/18/2019) 60 capsule 1 Not Taking at Unknown time    Patient Stressors: Substance abuse Traumatic event  Patient Strengths: Ability for insight Communication skills General fund of knowledge Supportive family/friends  Treatment Modalities: Medication Management, Group therapy, Case management,  1 to 1 session with clinician, Psychoeducation, Recreational therapy.   Physician Treatment Plan for Primary Diagnosis: <  principal problem not specified> Long Term Goal(s): Improvement in symptoms so as ready for discharge Improvement in symptoms so as ready for discharge   Short Term Goals: Ability to identify changes in lifestyle to reduce recurrence of condition will improve Ability to verbalize feelings will improve Ability to disclose and discuss suicidal ideas Ability to demonstrate self-control will improve Ability to identify and develop effective coping behaviors will improve Ability to verbalize feelings will improve Ability to disclose and discuss suicidal ideas Ability to demonstrate self-control will improve Ability to identify and develop effective coping behaviors will improve  Medication  Management: Evaluate patient's response, side effects, and tolerance of medication regimen.  Therapeutic Interventions: 1 to 1 sessions, Unit Group sessions and Medication administration.  Evaluation of Outcomes: Progressing  Physician Treatment Plan for Secondary Diagnosis: Active Problems:   Psychosis (HCC)  Long Term Goal(s): Improvement in symptoms so as ready for discharge Improvement in symptoms so as ready for discharge   Short Term Goals: Ability to identify changes in lifestyle to reduce recurrence of condition will improve Ability to verbalize feelings will improve Ability to disclose and discuss suicidal ideas Ability to demonstrate self-control will improve Ability to identify and develop effective coping behaviors will improve Ability to verbalize feelings will improve Ability to disclose and discuss suicidal ideas Ability to demonstrate self-control will improve Ability to identify and develop effective coping behaviors will improve     Medication Management: Evaluate patient's response, side effects, and tolerance of medication regimen.  Therapeutic Interventions: 1 to 1 sessions, Unit Group sessions and Medication administration.  Evaluation of Outcomes: Progressing   RN Treatment Plan for Primary Diagnosis: <principal problem not specified> Long Term Goal(s): Knowledge of disease and therapeutic regimen to maintain health will improve  Short Term Goals: Ability to participate in decision making will improve, Ability to verbalize feelings will improve, Ability to disclose and discuss suicidal ideas, Ability to identify and develop effective coping behaviors will improve and Compliance with prescribed medications will improve  Medication Management: RN will administer medications as ordered by provider, will assess and evaluate patient's response and provide education to patient for prescribed medication. RN will report any adverse and/or side effects to prescribing  provider.  Therapeutic Interventions: 1 on 1 counseling sessions, Psychoeducation, Medication administration, Evaluate responses to treatment, Monitor vital signs and CBGs as ordered, Perform/monitor CIWA, COWS, AIMS and Fall Risk screenings as ordered, Perform wound care treatments as ordered.  Evaluation of Outcomes: Progressing   LCSW Treatment Plan for Primary Diagnosis: <principal problem not specified> Long Term Goal(s): Safe transition to appropriate next level of care at discharge, Engage patient in therapeutic group addressing interpersonal concerns.  Short Term Goals: Engage patient in aftercare planning with referrals and resources and Increase skills for wellness and recovery  Therapeutic Interventions: Assess for all discharge needs, 1 to 1 time with Social worker, Explore available resources and support systems, Assess for adequacy in community support network, Educate family and significant other(s) on suicide prevention, Complete Psychosocial Assessment, Interpersonal group therapy.  Evaluation of Outcomes: Progressing   Progress in Treatment: Attending groups: Yes. Participating in groups: Yes. Taking medication as prescribed: Yes. Toleration medication: Yes. Family/Significant other contact made: Yes, individual(s) contacted:  pt's mom Patient understands diagnosis: No. Discussing patient identified problems/goals with staff: Yes. Medical problems stabilized or resolved: Yes. Denies suicidal/homicidal ideation: Yes. Issues/concerns per patient self-inventory: No. Other:   New problem(s) identified: No, Describe:  None  New Short Term/Long Term Goal(s): Medication stabilization, elimination of SI thoughts,  and development of a comprehensive mental wellness plan.   Patient Goals:  "Move forward"  Discharge Plan or Barriers: Patient will be discharging either home or inpatient at the [place].  Reason for Continuation of Hospitalization: Aggression Medication  stabilization Other; describe placement  Estimated Length of Stay: 1-2 days   Attendees: Patient:  03/05/2019   Physician: Dr. [name], MD 03/05/2019   Nursing: Vladimir Faster, RN 03/05/2019   RN Care Manager: 03/05/2019   Social Worker: Ardelle Anton, LCSW  03/05/2019   Recreational Therapist:  03/05/2019   MSW Intern: Ovidio Kin 03/05/2019   Other:  03/05/2019   Other: 03/05/2019      Scribe for Treatment Team: Trecia Rogers, LCSW 03/05/2019 1:34 PM

## 2019-03-05 NOTE — Progress Notes (Signed)
[facility] MD Progress Note  03/05/2019 10:27 AM Zachary Eaton  MRN:  [id number] Subjective:   Patient continues to be singularly focused on discharge we are having a family meeting prior to that, probably tomorrow.  Hopefully [name] will be opened by Friday again patient does well in the hospital but is noncompliant at home.  No EPS or TD Principal Problem: Schizoaffective condition  Diagnosis: Active Problems:   Psychosis ([place])  Total Time spent with patient: 20 minutes  Past Psychiatric History: see eval  Past Medical History:  Past Medical History:  Diagnosis Date  . Migraines   . Traumatic brain injury [facility])     Past Surgical History:  Procedure Laterality Date  . ANKLE SURGERY Left    Family History: History reviewed. No pertinent family history. Family Psychiatric  History: See eval Social History:  Social History   Substance and Sexual Activity  Alcohol Use Yes   Comment: occ     Social History   Substance and Sexual Activity  Drug Use Not Currently    Social History   Socioeconomic History  . Marital status: Single    Spouse name: Not on file  . Number of children: Not on file  . Years of education: Not on file  . Highest education level: Not on file  Occupational History  . Not on file  Social Needs  . Financial resource strain: Not on file  . Food insecurity    Worry: Not on file    Inability: Not on file  . Transportation needs    Medical: Not on file    Non-medical: Not on file  Tobacco Use  . Smoking status: Current Some Day Smoker    Packs/day: 2.00    Types: Cigarettes  . Smokeless tobacco: Current User    Types: Chew  . Tobacco comment: patient declined  Substance and Sexual Activity  . Alcohol use: Yes    Comment: occ  . Drug use: Not Currently  . Sexual activity: Not Currently  Lifestyle  . Physical activity    Days per week: Not on file    Minutes per session: Not on file  . Stress: Not on file  Relationships  .  Social Herbalist on phone: Not on file    Gets together: Not on file    Attends religious service: Not on file    Active member of club or organization: Not on file    Attends meetings of clubs or organizations: Not on file    Relationship status: Not on file  Other Topics Concern  . Not on file  Social History Narrative  . Not on file   Additional Social History:                         Sleep: Good  Appetite:  Good  Current Medications: Current Facility-Administered Medications  Medication Dose Route Frequency Provider Last Rate Last Dose  . acetaminophen (TYLENOL) tablet 650 mg  650 mg Oral Q6H PRN [name], NP   650 mg at 02/22/19 0155  . alum & mag hydroxide-simeth (MAALOX/MYLANTA) 200-200-20 MG/5ML suspension 30 mL  30 mL Oral Q4H PRN [name], NP      . divalproex (DEPAKOTE) DR tablet 250 mg  250 mg Oral Daily [name], MD   250 mg at 03/05/19 0817  . divalproex (DEPAKOTE) DR tablet 500 mg  500 mg Oral QHS Mallie Darting Cordie Grice, MD  500 mg at 03/04/19 2130  . feeding supplement (ENSURE ENLIVE) (ENSURE ENLIVE) liquid 237 mL  237 mL Oral BID BM Malvin JohnsFarah, Natan Hartog, MD   237 mL at 03/03/19 1429  . haloperidol lactate (HALDOL) injection 10 mg  10 mg Intramuscular BID PRN Malvin JohnsFarah, Robyn Galati, MD   10 mg at 02/19/19 1628  . lithium carbonate capsule 450 mg  450 mg Oral QHS Antonieta Pertlary, Greg Lawson, MD   450 mg at 03/04/19 2130  . OLANZapine zydis (ZYPREXA) disintegrating tablet 10 mg  10 mg Oral Q8H PRN Antonieta Pertlary, Greg Lawson, MD   10 mg at 02/21/19 2344   And  . LORazepam (ATIVAN) tablet 1 mg  1 mg Oral PRN Antonieta Pertlary, Greg Lawson, MD      . magnesium hydroxide (MILK OF MAGNESIA) suspension 30 mL  30 mL Oral Daily PRN Denzil Magnusonhomas, Lashunda, NP      . nicotine polacrilex (NICORETTE) gum 2 mg  2 mg Oral PRN Jearld Leschixon, Rashaun M, NP   2 mg at 03/03/19 2040  . omega-3 acid ethyl esters (LOVAZA) capsule 1 g  1 g Oral BID Denzil Magnusonhomas, Lashunda, NP   1 g at 03/05/19 0817  . paliperidone  (INVEGA SUSTENNA) injection 234 mg  234 mg Intramuscular Once Malvin JohnsFarah, Donnella Morford, MD      . risperiDONE (RISPERDAL) tablet 6 mg  6 mg Oral QHS Malvin JohnsFarah, Josephene Marrone, MD   6 mg at 03/04/19 2130  . sodium chloride (OCEAN) 0.65 % nasal spray 1 spray  1 spray Each Nare PRN Nira ConnBerry, Jason A, NP   1 spray at 02/28/19 2244  . temazepam (RESTORIL) capsule 30 mg  30 mg Oral QHS Malvin JohnsFarah, Keyonia Gluth, MD   30 mg at 03/04/19 2130    Lab Results:  Results for orders placed or performed during the hospital encounter of 02/17/19 (from the past 48 hour(s))  Hepatic function panel     Status: Abnormal   Collection Time: 03/03/19  6:23 PM  Result Value Ref Range   Total Protein 7.7 6.5 - 8.1 g/dL   Albumin 4.6 3.5 - 5.0 g/dL   AST 37 15 - 41 U/L   ALT 59 (H) 0 - 44 U/L   Alkaline Phosphatase 46 38 - 126 U/L   Total Bilirubin 0.2 (L) 0.3 - 1.2 mg/dL   Bilirubin, Direct 0.1 0.0 - 0.2 mg/dL   Indirect Bilirubin 0.1 (L) 0.3 - 0.9 mg/dL    Comment: Performed at [facility], [address] [place], [place] [id number]    Blood Alcohol level:  Lab Results  Component Value Date   [facility]   ETH <10 06/11/2018    Metabolic Disorder Labs: Lab Results  Component Value Date   HGBA1C 4.9 02/18/2019   MPG 93.93 02/18/2019   Lab Results  Component Value Date   PROLACTIN 27.8 (H) 02/18/2019   Lab Results  Component Value Date   CHOL 138 02/18/2019   TRIG 71 02/18/2019   HDL 44 02/18/2019   CHOLHDL 3.1 02/18/2019   VLDL 14 02/18/2019   LDLCALC 80 02/18/2019    Physical Findings: AIMS: Facial and Oral Movements Muscles of Facial Expression: None, normal Lips and Perioral Area: None, normal Jaw: None, normal Tongue: None, normal,Extremity Movements Upper (arms, wrists, hands, fingers): None, normal Lower (legs, knees, ankles, toes): None, normal, Trunk Movements Neck, shoulders, hips: None, normal, Overall Severity Severity of abnormal movements (highest score from questions above): None,  normal Incapacitation due to abnormal movements: None, normal Patient's awareness of abnormal  movements (rate only patient's report): No Awareness, Dental Status Current problems with teeth and/or dentures?: No Does patient usually wear dentures?: No  CIWA:  CIWA-Ar Total: 1 COWS:  COWS Total Score: 1  Musculoskeletal: Strength & Muscle Tone: within normal limits Gait & Station: normal Patient leans: N/A  Psychiatric Specialty Exam: Physical Exam  ROS  Blood pressure 116/76, pulse (!) 119, temperature 98.2 F (36.8 C), temperature source Oral, resp. rate 18, height 5\' 11"  (1.803 m), weight 79.4 kg.Body mass index is 24.41 kg/m.  General Appearance: Casual  Eye Contact:  Good  Speech:  Clear and Coherent  Volume:  Decreased  Mood:  Dysphoric  Affect:  Blunt  Thought Process:  Linear and Descriptions of Associations: Circumstantial  Orientation:  Full (Time, Place, and Person)  Thought Content:  Illogical  Suicidal Thoughts:  No  Homicidal Thoughts:  No  Memory:  Immediate;   Fair Recent;   Fair Remote;   Fair  Judgement:  Impaired  Insight:  Lacking  Psychomotor Activity:  Decreased  Concentration:  Concentration: Fair and Attention Span: Fair  Recall:  of Knowledge:  Fair  Language:  Fair  Akathisia:  Negative  Handed:  Right  AIMS (if indicated):     Assets:  Leisure Time Physical Health  ADL's:  Intact  Cognition:  WNL  Sleep:  Number of Hours: 7     Treatment Plan Summary: Daily contact with patient to assess and evaluate symptoms and progress in treatment and Medication management  Continue current meds reality based therapy no change in precautions probable discharge to [facility] by Friday or home after family meeting  Friday, MD 03/05/2019, 10:27 AM

## 2019-03-05 NOTE — Progress Notes (Signed)
D: The patient was redirected for receiving a hug from a male peer A: Patient was redirected. R: Patient felt that his peer needed a hug and wanted to know if this Zachary Eaton would stand in for him once the embrace was broken up. The two patients were encouraged not to touch.

## 2019-03-05 NOTE — Progress Notes (Signed)
Patient denies SI, HI and AVH this shift.  Patient has had no issues of behavioral dyscontrol.  Patient has been compliant with all medications and attended groups.   Assess patient for safety, offer medications as prescribed, engage patient in 1:1 staff talks.   Patient able to maintain safety. Offer medications as planned.

## 2019-03-05 NOTE — Progress Notes (Signed)
Patient states that he slept a great deal today and that it helped with the day passing. He states that he is ready to be discharged. His goal for tomorrow is to get discharged.

## 2019-03-05 NOTE — Progress Notes (Signed)
Recreation Therapy Notes  Date: 10.21.20 Time: 1000 Location: 500 Hall Dayroom   Group Topic: Communication, Team Building, Problem Solving  Goal Area(s) Addresses:  Patient will effectively work with peer towards shared goal.  Patient will identify skills used to make activity successful.  Patient will identify how skills used during activity can be used to reach post d/c goals.   Intervention: STEM Activity  Activity:  Cup Stack.  In groups of 4, patients were given 10 red cups and rubber band with four strings attached to it.  Patients were to use the rubber band contraption to sit the cups upright and then stack them like a pyramid.  Education: Social Skills, Discharge Planning   Education Outcome: Acknowledges education/In group clarification offered/Needs additional education.   Clinical Observations/Feedback:  Pt did not attend group.     Pranish Akhavan, LRT/CTRS         Kaan Tosh A 03/05/2019 12:13 PM 

## 2019-03-06 DIAGNOSIS — F250 Schizoaffective disorder, bipolar type: Secondary | ICD-10-CM | POA: Diagnosis not present

## 2019-03-06 NOTE — Progress Notes (Signed)
Pt visible on the unit, pt stated he was doing good, pt said he was ready for D/C

## 2019-03-06 NOTE — Progress Notes (Signed)
[facility] MD Progress Note  03/06/2019 10:16 AM Zachary Eaton  MRN:  [id number] Subjective:    Patient continues to be singularly focused on discharge and discharge planning and states he will go to the [facility] if available but it does not appear the bed will be forthcoming soon he denies thoughts of harming self or others he understands what it is to contract again is focused on leaving but he had does have a long-acting injectable on board he is cooperative but again minimizing or denying all past symptoms of alert oriented to person place situation not exact date no EPS or TD.  No delusional material exhibited or professed Principal Problem: Schizoaffective Diagnosis: Active Problems:   Psychosis ([place])  Total Time spent with patient: 20 minutes  Past Psychiatric History: Recent presentation in February but noncompliant upon discharge  Past Medical History:  Past Medical History:  Diagnosis Date  . Migraines   . Traumatic brain injury [facility])     Past Surgical History:  Procedure Laterality Date  . ANKLE SURGERY Left    Family History: History reviewed. No pertinent family history. Family Psychiatric  History: No new data shared Social History:  Social History   Substance and Sexual Activity  Alcohol Use Yes   Comment: occ     Social History   Substance and Sexual Activity  Drug Use Not Currently    Social History   Socioeconomic History  . Marital status: Single    Spouse name: Not on file  . Number of children: Not on file  . Years of education: Not on file  . Highest education level: Not on file  Occupational History  . Not on file  Social Needs  . Financial resource strain: Not on file  . Food insecurity    Worry: Not on file    Inability: Not on file  . Transportation needs    Medical: Not on file    Non-medical: Not on file  Tobacco Use  . Smoking status: Current Some Day Smoker    Packs/day: 2.00    Types: Cigarettes  . Smokeless tobacco:  Current User    Types: Chew  . Tobacco comment: patient declined  Substance and Sexual Activity  . Alcohol use: Yes    Comment: occ  . Drug use: Not Currently  . Sexual activity: Not Currently  Lifestyle  . Physical activity    Days per week: Not on file    Minutes per session: Not on file  . Stress: Not on file  Relationships  . Social Herbalist on phone: Not on file    Gets together: Not on file    Attends religious service: Not on file    Active member of club or organization: Not on file    Attends meetings of clubs or organizations: Not on file    Relationship status: Not on file  Other Topics Concern  . Not on file  Social History Narrative  . Not on file   Additional Social History:                         Sleep: Good  Appetite:  Good  Current Medications: Current Facility-Administered Medications  Medication Dose Route Frequency Provider Last Rate Last Dose  . acetaminophen (TYLENOL) tablet 650 mg  650 mg Oral Q6H PRN [name], NP   650 mg at 02/22/19 0155  . alum & mag hydroxide-simeth (MAALOX/MYLANTA) 200-200-20 MG/5ML suspension  30 mL  30 mL Oral Q4H PRN Denzil Magnuson, NP      . divalproex (DEPAKOTE) DR tablet 250 mg  250 mg Oral Daily Malvin Johns, MD   250 mg at 03/06/19 2505  . divalproex (DEPAKOTE) DR tablet 500 mg  500 mg Oral QHS Antonieta Pert, MD   500 mg at 03/05/19 2106  . feeding supplement (ENSURE ENLIVE) (ENSURE ENLIVE) liquid 237 mL  237 mL Oral BID BM Malvin Johns, MD   237 mL at 03/03/19 1429  . haloperidol lactate (HALDOL) injection 10 mg  10 mg Intramuscular BID PRN Malvin Johns, MD   10 mg at 02/19/19 1628  . lithium carbonate capsule 450 mg  450 mg Oral QHS Antonieta Pert, MD   450 mg at 03/05/19 2106  . OLANZapine zydis (ZYPREXA) disintegrating tablet 10 mg  10 mg Oral Q8H PRN Antonieta Pert, MD   10 mg at 02/21/19 2344   And  . LORazepam (ATIVAN) tablet 1 mg  1 mg Oral PRN Antonieta Pert, MD       . magnesium hydroxide (MILK OF MAGNESIA) suspension 30 mL  30 mL Oral Daily PRN Denzil Magnuson, NP      . nicotine polacrilex (NICORETTE) gum 2 mg  2 mg Oral PRN Jearld Lesch, NP   2 mg at 03/03/19 2040  . omega-3 acid ethyl esters (LOVAZA) capsule 1 g  1 g Oral BID Denzil Magnuson, NP   1 g at 03/06/19 0824  . paliperidone (INVEGA SUSTENNA) injection 234 mg  234 mg Intramuscular Once Malvin Johns, MD      . risperiDONE (RISPERDAL) tablet 6 mg  6 mg Oral QHS Malvin Johns, MD   6 mg at 03/05/19 2106  . sodium chloride (OCEAN) 0.65 % nasal spray 1 spray  1 spray Each Nare PRN Nira Conn A, NP   1 spray at 02/28/19 2244  . temazepam (RESTORIL) capsule 30 mg  30 mg Oral QHS Malvin Johns, MD   30 mg at 03/05/19 2107    Lab Results: No results found for this or any previous visit (from the past 48 hour(s)).  Blood Alcohol level:  Lab Results  Component Value Date   ETH <10 02/17/2019   ETH <10 06/11/2018    Metabolic Disorder Labs: Lab Results  Component Value Date   HGBA1C 4.9 02/18/2019   MPG 93.93 02/18/2019   Lab Results  Component Value Date   PROLACTIN 27.8 (H) 02/18/2019   Lab Results  Component Value Date   CHOL 138 02/18/2019   TRIG 71 02/18/2019   HDL 44 02/18/2019   CHOLHDL 3.1 02/18/2019   VLDL 14 02/18/2019   LDLCALC 80 02/18/2019    Physical Findings: AIMS: Facial and Oral Movements Muscles of Facial Expression: None, normal Lips and Perioral Area: None, normal Jaw: None, normal Tongue: None, normal,Extremity Movements Upper (arms, wrists, hands, fingers): None, normal Lower (legs, knees, ankles, toes): None, normal, Trunk Movements Neck, shoulders, hips: None, normal, Overall Severity Severity of abnormal movements (highest score from questions above): None, normal Incapacitation due to abnormal movements: None, normal Patient's awareness of abnormal movements (rate only patient's report): No Awareness, Dental Status Current problems with teeth  and/or dentures?: No Does patient usually wear dentures?: No  CIWA:  CIWA-Ar Total: 1 COWS:  COWS Total Score: 1  Musculoskeletal: Strength & Muscle Tone: within normal limits Gait & Station: normal Patient leans: N/A  Psychiatric Specialty Exam: Physical Exam  ROS  Blood pressure  116/76, pulse (!) 119, temperature 98.2 F (36.8 C), temperature source Oral, resp. rate 18, height 5\' 11"  (1.803 m), weight 79.4 kg.Body mass index is 24.41 kg/m.  General Appearance: Casual  Eye Contact:  Good  Speech:  Clear and Coherent  Volume:  Decreased  Mood:  Dysphoric  Affect:  Congruent  Thought Process:  Goal Directed and Descriptions of Associations: Circumstantial  Orientation:  Full (Time, Place, and Person)  Thought Content:  Logical  Suicidal Thoughts:  No  Homicidal Thoughts:  No  Memory:  Immediate;   Fair Recent;   Fair Remote;   Fair  Judgement:  Fair  Insight:  Fair  Psychomotor Activity:  Normal  Concentration:  Concentration: Fair and Attention Span: Fair  Recall:  [employer]Fair  Fund of Knowledge:  Fair  Language:  Fair  Akathisia:  Negative  Handed:  Right  AIMS (if indicated):     Assets:  Communication Skills Desire for Improvement  ADL's:  Intact  Cognition:  WNL  Sleep:  Number of Hours: 8     Treatment Plan Summary: Daily contact with patient to assess and evaluate symptoms and progress in treatment and Medication management  Continue current precautions continue to monitor for safety has long-acting injectable on board no change in precautions today probable discharge tomorrow after family meeting is the plan  Malvin JohnsFARAH,Rhys Anchondo, MD 03/06/2019, 10:16 AM

## 2019-03-06 NOTE — Progress Notes (Signed)
The patient rated his day as a 5 or 7 out of a possible 10. He states that he slept for much of the day. His goal for tomorrow is to get discharged and stay calm.

## 2019-03-07 DIAGNOSIS — F250 Schizoaffective disorder, bipolar type: Secondary | ICD-10-CM | POA: Diagnosis not present

## 2019-03-07 MED ORDER — RISPERIDONE 3 MG PO TABS: 6.0000 mg | ORAL_TABLET | Freq: Every day | ORAL | 0 refills | Status: DC

## 2019-03-07 MED ORDER — PALIPERIDONE PALMITATE ER 234 MG/1.5ML IM SUSY: 234.0000 mg | PREFILLED_SYRINGE | Freq: Once | INTRAMUSCULAR | 0 refills | Status: DC

## 2019-03-07 MED ORDER — TEMAZEPAM 30 MG PO CAPS: 30.0000 mg | ORAL_CAPSULE | Freq: Every day | ORAL | 0 refills | Status: DC

## 2019-03-07 MED ORDER — DIVALPROEX SODIUM 250 MG PO DR TAB: DELAYED_RELEASE_TABLET | ORAL | 0 refills | Status: DC

## 2019-03-07 MED ORDER — NICOTINE POLACRILEX 2 MG MT GUM: 2.0000 mg | CHEWING_GUM | OROMUCOSAL | 0 refills | Status: DC | PRN

## 2019-03-07 MED ORDER — SALINE SPRAY 0.65 % NA SOLN: 1.0000 | NASAL | 0 refills | Status: DC | PRN

## 2019-03-07 MED ORDER — LITHIUM CARBONATE 150 MG PO CAPS: 450.0000 mg | ORAL_CAPSULE | Freq: Every day | ORAL | 0 refills | Status: DC

## 2019-03-07 NOTE — Progress Notes (Signed)
  [facility] Adult Case Management Discharge Plan :  Will you be returning to the same living situation after discharge:  Yes,  home with parents At discharge, do you have transportation home?: Yes,  pt's parents Do you have the ability to pay for your medications: Yes,  [facility] benefits  Release of information consent forms completed and in the chart;  Patient's signature needed at discharge.  Patient to Follow up at: Follow-up Information    [facility] on [facility]/13/2019.   Why: Pharmacologist with Dr. Delton See is Thursday, 10/29 at 1:00p. Medication management with Dr. Thayer Jew is Tuesday, 11/3 at 2:00p.  Appts will be over the phone. If you would like to have a therapist please ask provider to recommend therapist in office.  Contact information: [place] [id number] ph: [phone] fx: [phone]          Next level of care provider has access to [place] and Suicide Prevention discussed: Yes,  pt's mother  Have you used any form of tobacco in the last 30 days? (Cigarettes, Smokeless Tobacco, Cigars, and/or Pipes): Yes  Has patient been referred to the Quitline?: Patient refused referral  Patient has been referred for addiction treatment: Yes  Trecia Rogers, LCSW 03/07/2019, 10:32 AM

## 2019-03-07 NOTE — Progress Notes (Signed)
Recreation Therapy Notes  INPATIENT RECREATION TR PLAN  Patient Details Name: Zachary Eaton MRN: [id number] DOB: Apr 21, 1986 Today's Date: 03/07/2019  Rec Therapy Plan Is patient appropriate for Therapeutic Recreation?: Yes Treatment times per week: about 3 days Estimated Length of Stay: 5-7 days TR Treatment/Interventions: Group participation (Comment)  Discharge Criteria Pt will be discharged from therapy if:: Discharged Treatment plan/goals/alternatives discussed and agreed upon by:: Patient/family  Discharge Summary Short term goals set: See patient care plan Short term goals met: Adequate for discharge Progress toward goals comments: Groups attended Which groups?: Self-esteem, Coping skills, Wellness, Other (Comment)(Triggers) Reason goals not met: None Therapeutic equipment acquired: N/A Reason patient discharged from therapy: Discharge from hospital Pt/family agrees with progress & goals achieved: Yes Date patient discharged from therapy: 03/07/19   Victorino Sparrow, LRT/CTRS  Ria Comment, [name] 03/07/2019, 11:26 AM

## 2019-03-07 NOTE — Discharge Summary (Signed)
Physician Discharge Summary Note  Patient:  Zachary Eaton is an 33 y.o., male  MRN:  [id number]  DOB:  21-Oct-1985  Patient phone:  [phone] (home)   Patient address:   [place] [address],   Total Time spent with patient: Greater than 30 minutes  Date of Admission:  02/17/2019  Date of Discharge: 03-07-19  Reason for Admission: Worsening hallucinations, yelling at unseen others & quite volatile.  Principal Problem: Bipolar I disorder [facility])  Discharge Diagnoses: Principal Problem:   Bipolar I disorder ([place]) Active Problems:   Psychosis ([place])  Past Psychiatric History:   Past Medical History:  Past Medical History:  Diagnosis Date  . Migraines   . Traumatic brain injury [facility])     Past Surgical History:  Procedure Laterality Date  . ANKLE SURGERY Left    Family History: History reviewed. No pertinent family history.  Family Psychiatric  History: See H&P  Social History:  Social History   Substance and Sexual Activity  Alcohol Use Yes   Comment: occ     Social History   Substance and Sexual Activity  Drug Use Not Currently    Social History   Socioeconomic History  . Marital status: Single    Spouse name: Not on file  . Number of children: Not on file  . Years of education: Not on file  . Highest education level: Not on file  Occupational History  . Not on file  Social Needs  . Financial resource strain: Not on file  . Food insecurity    Worry: Not on file    Inability: Not on file  . Transportation needs    Medical: Not on file    Non-medical: Not on file  Tobacco Use  . Smoking status: Current Some Day Smoker    Packs/day: 2.00    Types: Cigarettes  . Smokeless tobacco: Current User    Types: Chew  . Tobacco comment: patient declined  Substance and Sexual Activity  . Alcohol use: Yes    Comment: occ  . Drug use: Not Currently  . Sexual activity: Not Currently  Lifestyle  . Physical activity    Days per  week: Not on file    Minutes per session: Not on file  . Stress: Not on file  Relationships  . Social Herbalist on phone: Not on file    Gets together: Not on file    Attends religious service: Not on file    Active member of club or organization: Not on file    Attends meetings of clubs or organizations: Not on file    Relationship status: Not on file  Other Topics Concern  . Not on file  Social History Narrative  . Not on file   Hospital Course: (Per Md's admission evaluation): Zachary Eaton was well-known to the service last here in February, he is a 33 year old veteran with a history of multiple traumatic brain injuries, PTSD symptoms, and resultant psychosis.  He fits the criteria for a schizoaffective type condition, bipolar.  Though he stabilized fairly well here on antipsychotic/mood stabilizer therapy was transferred to the [facility] in late February, released approximately 6 days later but was noncompliant with medications. More recently notes indicate he is been suffering from hallucinations, yelling at unseen others, and quite volatile.  He is destroyed his home, taken and asked to the walls and so forth, shot arrows on the door and is also been abusing cannabis products according  to the mother she is found various cannabis labeled products.  This is one of several psychiatric discharge summaries for this 33 year old male with hx of chronic mental illness & multiple psychiatric admissions. He is known in this [facility] & other psychiatric hospitals within the surrounding areas for worsening symptoms of his mental illness requiring hospitalizations. He has been tried on multiple psychotropic medications for his symptoms & it appears nothing has actually been helpful in stabilizing his symptoms.  After evaluation of his presenting symptoms, [place] was recommended for mood stabilization treatments. The medication regimen for his presenting symptoms were discussed & with his consent  initiated. He received, stabilized & was discharged on the medications as listed below on his discharge medication lists. He was also enrolled & participated in the group counseling sessions being offered & held on this unit. He learned coping skills. Zachary Eaton presented on this admission, no other chronic medical conditions that required treatment & monitoring. He tolerated her treatment regimen without any adverse effects or reactions reported.  And because of the chronic nature of his psychiatric symptoms & their resistance to the treatment regimen, Zachary Eaton was treated, stabilized & being discharged on two separate antipsychotic medications Hinda Glatter Lorelei Pont monthly injectable & Risperdal tabs). This is because he has not been able to achieve symptoms control under an antipsychotic monotherapy. These two combination antipsychotic therapies seem effective in stabilizing his symptoms at this time. It will benefit this patient to continue on these combination antipsychotic therapies as recommended. However, as his symptoms continue to improve, he may then be titrated down to an antipsychotic monotherapy. This is to prevent the chances for the development of metabolic syndrome usually associated with use of multiple antipsychotic regimen. This has to be done within the proper monitoring, discretion & judgement of his outpatient psychiatric provider.  During the course of his hospitalization, the 15-minute checks were adequate to ensure Zachary Eaton's safety. He interacted with patients & staff appropriately, participated appropriately in the group sessions/therapies. His medications were addressed & adjusted to meet his needs. He was recommended for outpatient follow-up care & medication management upon discharge to assure his continuity of care.  At the time of discharge patient is not reporting any acute suicidal/homicidal ideations. He feels more confident about his self-care & in managing the aggressive behaviors. He  currently denies any new issues or concerns. Education and supportive counseling provided throughout his hospital stay & upon discharge.  Today upon his discharge evaluation with the attending psychiatrist, Nnamdi shares he is doing well. He denies any other specific concerns. He is sleeping well. His appetite is good. He denies other physical complaints. He denies AH/VH. He feels that his medications have been helpful & is in agreement to continue his current treatment regimen as recommended. He was able to engage in safety planning including plan to return to [facility] or contact emergency services if he feels unable to maintain his own safety or the safety of others. Pt had no further questions, comments, or concerns. He left [facility] with all personal belongings in no apparent distress. Transportation per her parents.    Physical Findings: AIMS: Facial and Oral Movements Muscles of Facial Expression: None, normal Lips and Perioral Area: None, normal Jaw: None, normal Tongue: None, normal,Extremity Movements Upper (arms, wrists, hands, fingers): None, normal Lower (legs, knees, ankles, toes): None, normal, Trunk Movements Neck, shoulders, hips: None, normal, Overall Severity Severity of abnormal movements (highest score from questions above): None, normal Incapacitation due to abnormal movements: None, normal Patient's  awareness of abnormal movements (rate only patient's report): No Awareness, Dental Status Current problems with teeth and/or dentures?: No Does patient usually wear dentures?: No  CIWA:  CIWA-Ar Total: 1 COWS:  COWS Total Score: 1  Musculoskeletal: Strength & Muscle Tone: within normal limits Gait & Station: normal Patient leans: N/A  Psychiatric Specialty Exam: Physical Exam  Nursing note and vitals reviewed. Constitutional: He is oriented to person, place, and time. He appears well-developed.  Neck: Normal range of motion.  Cardiovascular: Normal rate.  Respiratory: No  respiratory distress. He has no wheezes.  Genitourinary:    Genitourinary Comments: Deferred   Musculoskeletal: Normal range of motion.  Neurological: He is alert and oriented to person, place, and time.  Skin: Skin is warm.    Review of Systems  Constitutional: Negative for chills and fever.  Respiratory: Negative for cough, shortness of breath and wheezing.   Cardiovascular: Negative for chest pain and palpitations.  Gastrointestinal: Negative for heartburn, nausea and vomiting.  Neurological: Negative for dizziness and headaches.  Psychiatric/Behavioral: Positive for depression (Stabilized with medication prior to discharge) and hallucinations (Hx. Psychosis (Stabilized with medication prior to discharge). Negative for memory loss, substance abuse and suicidal ideas. The patient has insomnia (Stabilized with medication prior to discharge). The patient is not nervous/anxious.     Blood pressure 116/76, pulse (!) 119, temperature 98.2 F (36.8 C), temperature source Oral, resp. rate 18, height 5\' 11"  (1.803 m), weight 79.4 kg.Body mass index is 24.41 kg/m.  See Md's discharge SRA   Have you used any form of tobacco in the last 30 days? (Cigarettes, Smokeless Tobacco, Cigars, and/or Pipes): Yes  Has this patient used any form of tobacco in the last 30 days? (Cigarettes, Smokeless Tobacco, Cigars, and/or Pipes): Yes, an FDA-approved tobacco cessation medication was recommended at discharge.  Blood Alcohol level:  Lab Results  Component Value Date   [facility] <10 02/17/2019   ETH <10 06/11/2018   Metabolic Disorder Labs:  Lab Results  Component Value Date   HGBA1C 4.9 02/18/2019   MPG 93.93 02/18/2019   Lab Results  Component Value Date   PROLACTIN 27.8 (H) 02/18/2019   Lab Results  Component Value Date   CHOL 138 02/18/2019   TRIG 71 02/18/2019   HDL 44 02/18/2019   CHOLHDL 3.1 02/18/2019   VLDL 14 02/18/2019   LDLCALC 80 02/18/2019   See Psychiatric Specialty Exam and  Suicide Risk Assessment completed by Attending Physician prior to discharge.  Discharge destination:  Home  Is patient on multiple antipsychotic therapies at discharge:  Yes,   Do you recommend tapering to monotherapy for antipsychotics?  Yes   Has Patient had three or more failed trials of antipsychotic monotherapy by history:  Yes,   Antipsychotic medications that previously failed include:   1.  Abilify., 2.  Olanzapine. and 3.  Trilafon.  Recommended Plan for Multiple Antipsychotic Therapies: Additional reason(s) for multiple antispychotic treatment:  Patient's symptoms has faliled to respond adequately to an antipsychotic monotherapy prompting the use of multiple antipsychotic medications.  Allergies as of 03/07/2019      Reactions   Penicillins    Did it involve swelling of the face/tongue/throat, SOB, or low BP? Unknown Did it involve sudden or severe rash/hives, skin peeling, or any reaction on the inside of your mouth or nose? Unknown Did you need to seek medical attention at a hospital or doctor's office? Unknown When did it last happen?childhood If all above answers are "NO", may proceed with cephalosporin  use.      Medication List    STOP taking these medications   divalproex 125 MG capsule Commonly known as: DEPAKOTE SPRINKLE Replaced by: divalproex 250 MG DR tablet     TAKE these medications     Indication  divalproex 250 MG DR tablet Commonly known as: DEPAKOTE Take 1 tablet (250 mg) by mouth in the morning & 2 tablets (500 mg) at bedtime: For mood stabilization Replaces: divalproex 125 MG capsule  Indication: Mood stabilization   lithium carbonate 150 MG capsule Take 3 capsules (450 mg total) by mouth at bedtime. For mood stabilization What changed:   how much to take  additional instructions  Indication: Mood stabilization   nicotine polacrilex 2 MG gum Commonly known as: NICORETTE Take 1 each (2 mg total) by mouth as needed. (May buy from over  the counter): For smoking cessation  Indication: Nicotine Addiction   omega-3 acid ethyl esters 1 g capsule Commonly known as: LOVAZA Take 1 capsule (1 g total) by mouth 2 (two) times daily.  Indication: High Amount of Triglycerides in the Blood   paliperidone 234 MG/1.5ML Susy injection Commonly known as: INVEGA SUSTENNA Inject 234 mg into the muscle once for 1 dose. (Due on 03-18-19): For mood control Start taking on: March 18, 2019  Indication: Mood control   risperiDONE 3 MG tablet Commonly known as: RISPERDAL Take 2 tablets (6 mg total) by mouth at bedtime. For mood control  Indication: Mood control   sodium chloride 0.65 % Soln nasal spray Commonly known as: OCEAN Place 1 spray into both nostrils as needed for congestion.  Indication: Nasal congestion   temazepam 30 MG capsule Commonly known as: RESTORIL Take 1 capsule (30 mg total) by mouth at bedtime. For sleep  Indication: Trouble Sleeping      Follow-up Information    [facility].   Why: Pharmacologist with Dr. Lum Keaselahnty is Thursday, 10/29 at 1:00p. Medication management with Dr. Silvio PateIheagwara is Tuesday, 11/3 at 2:00p.  Appts will be over the phone. If you would like to have a therapist please ask provider to recommend therapist in office.  Contact information: [address]  [place] [address] ph: [phone] fx: [phone]         Follow-up recommendations:  Activity:  As tolerated Diet: As recommended by your primary care doctor. Keep all scheduled follow-up appointments as recommended.  Comments: Patient is instructed prior to discharge to: Take all medications as prescribed by his/her mental healthcare provider. Report any adverse effects and or reactions from the medicines to his/her outpatient provider promptly. Patient has been instructed & cautioned: To not engage in alcohol and or illegal drug use while on prescription medicines. In the  event of worsening symptoms, patient is instructed to call the crisis hotline, 911 and or go to the nearest ED for appropriate evaluation and treatment of symptoms. To follow-up with his/her primary care provider for your other medical issues, concerns and or health care needs.   Signed: Armandina StammerAgnes Kimbely Whiteaker, NP, PMHNP, FNP-BC. 03/07/2019, 2:22 PM

## 2019-03-07 NOTE — B[facility] Suicide Risk Assessment (Signed)
[facility] Discharge Suicide Risk Assessment   Principal Problem: Schizoaffective disorder presentation, as a conflation of neuropsychiatric pathology resultant from multiple TBI's and PTSD/complicated by noncompliance Discharge Diagnoses: Active Problems:   Psychosis ([place])   Total Time spent with patient: 45 minutes  Musculoskeletal: Strength & Muscle Tone: within normal limits Gait & Station: normal Patient leans: N/A  Psychiatric Specialty Exam: ROS  Blood pressure 116/76, pulse (!) 119, temperature 98.2 F (36.8 C), temperature source Oral, resp. rate 18, height 5\' 11"  (1.803 m), weight 79.4 kg.Body mass index is 24.41 kg/m.  General Appearance: Casual  Eye Contact::  Fair  Speech:  Clear and Coherent409  Volume:  Decreased  Mood:  Dysphoric  Affect:  Restricted  Thought Process:  Goal Directed and Descriptions of Associations: Circumstantial  Orientation:  Full (Time, Place, and Person)  Thought Content:  Rumination  Suicidal Thoughts:  No  Homicidal Thoughts:  No  Memory:  Immediate;   Fair Recent;   Good Remote;   Fair  Judgement:  Fair  Insight:  Fair  Psychomotor Activity:  NA and Normal  Concentration:  Good  Recall:  Good  Fund of Knowledge:Good  Language: Good  Akathisia:  Negative  Handed:  Right  AIMS (if indicated):     Assets:  Communication Skills Desire for Improvement  Sleep:  Number of Hours: 9  Cognition: WNL  ADL's:  Intact   Mental Status Per Nursing Assessment::   On Admission:  NA  Demographic Factors:  Male, Caucasian and Unemployed  Loss Factors: Decrease in vocational status  Historical Factors: Impulsivity  Risk Reduction Factors:   Positive social support and Positive therapeutic relationship  Continued Clinical Symptoms:  Previous Psychiatric Diagnoses and Treatments  Cognitive Features That Contribute To Risk:  Loss of executive function    Suicide Risk:  Minimal: No identifiable suicidal ideation.  Patients presenting  with no risk factors but with morbid ruminations; may be classified as minimal risk based on the severity of the depressive symptoms  Follow-up Information    [place] Follow up on 03/13/2019.   Why: Pharmacologist with Dr. Delton See is Thursday, 10/29 at 1:00p. Medication management with Dr. Thayer Jew is Tuesday, 11/3 at 2:00p.  Appts will be over the phone. If you would like to have a therapist please ask provider to recommend therapist in office.  Contact information: [place] [address] ph: [phone] fx: [phone]          Plan Of Care/Follow-up recommendations:  Activity:  full  Guns are locked in a safe  Sarann Tregre, MD 03/07/2019, 10:25 AM

## 2019-03-07 NOTE — Progress Notes (Signed)
CSW met with Zachary Eaton, sister, Zachary Eaton, Dr. Jake Samples, and Zachary [place] social worker, Zachary Eaton for a treatment plan regarding the pt discharging today.  Zachary Eaton voiced her concerns about the Zachary Eaton coming home and wanting the Zachary Eaton to commit with treatment.  [facility] social worker, Zachary Eaton, stated that he has follow up for medication management next week and she will sign him up for therapy and then get him signed up for intensive case management through the [place]. Zachary Eaton also provided resources for the family with [facility].  Zachary Eaton made the Zachary Eaton sign a contract to stay in their home which discussed the Zachary Eaton not having guns, ammunition, drugs, and alcohol in their home, paying rent, actively looking for additional housing, respecting the family, paying for damages, etc. Zachary Eaton did sign the contract.

## 2019-03-07 NOTE — Progress Notes (Signed)
Patient ID: Zachary Eaton, male   DOB: 1985/12/08, 33 y.o.   MRN: [id number] Patient discharged to home/self care in the presence of his parents.  Patient denies SI, HI and AVH.  Patient eager to continue care within the community.

## 2019-03-07 NOTE — Progress Notes (Signed)
Recreation Therapy Notes  Date: 10.23.20 Time: 1000 Location: 500 Hall  Group Topic: Team Building  Goal Area(s) Addresses:  Patient will effectively work with group towards shared goal. Patient will identify skills needed to complete activity. Patient will verbalize how skills can be used post d/c.  Intervention: Group Activity  Activity:  Sharks in [employer].  Each patient was given are rubber disc and as a whole the group was given a disc.  Patients were to work together using the disc to maneuver the group from one end of the hall to the other and back.  If anyone stepped off their disc the group would start over.  Education: Team Building, Discharge Planning  Education Outcome: Acknowledges understanding/In group clarification offered/Needs additional education.   Clinical Observations/Feedback: Pt did not attend in group.    Victorino Sparrow, LRT/CTRS         Ria Comment, Swetha Rayle A 03/07/2019 11:11 AM

## 2019-03-07 NOTE — Tx Team (Signed)
Interdisciplinary Treatment and Diagnostic Plan Update  03/07/2019 Time of Session: 10:15am Zachary LocketJoshua D Tumblin MRN: [id number]  Principal Diagnosis: Bipolar I disorder (HCC)  Secondary Diagnoses: Principal Problem:   Bipolar I disorder (HCC) Active Problems:   Psychosis (HCC)   Current Medications:  Current Facility-Administered Medications  Medication Dose Route Frequency Provider Last Rate Last Dose  . acetaminophen (TYLENOL) tablet 650 mg  650 mg Oral Q6H PRN Denzil Magnusonhomas, Lashunda, NP   650 mg at 02/22/19 0155  . alum & mag hydroxide-simeth (MAALOX/MYLANTA) 200-200-20 MG/5ML suspension 30 mL  30 mL Oral Q4H PRN Denzil Magnusonhomas, Lashunda, NP      . divalproex (DEPAKOTE) DR tablet 250 mg  250 mg Oral Daily Malvin JohnsFarah, Brian, MD   250 mg at 03/07/19 1004  . divalproex (DEPAKOTE) DR tablet 500 mg  500 mg Oral QHS Antonieta Pertlary, Greg Lawson, MD   500 mg at 03/06/19 2055  . feeding supplement (ENSURE ENLIVE) (ENSURE ENLIVE) liquid 237 mL  237 mL Oral BID BM Malvin JohnsFarah, Brian, MD   237 mL at 03/03/19 1429  . haloperidol lactate (HALDOL) injection 10 mg  10 mg Intramuscular BID PRN Malvin JohnsFarah, Brian, MD   10 mg at 02/19/19 1628  . lithium carbonate capsule 450 mg  450 mg Oral QHS Antonieta Pertlary, Greg Lawson, MD   450 mg at 03/06/19 2055  . OLANZapine zydis (ZYPREXA) disintegrating tablet 10 mg  10 mg Oral Q8H PRN Antonieta Pertlary, Greg Lawson, MD   10 mg at 02/21/19 2344   And  . LORazepam (ATIVAN) tablet 1 mg  1 mg Oral PRN Antonieta Pertlary, Greg Lawson, MD      . magnesium hydroxide (MILK OF MAGNESIA) suspension 30 mL  30 mL Oral Daily PRN Denzil Magnusonhomas, Lashunda, NP      . nicotine polacrilex (NICORETTE) gum 2 mg  2 mg Oral PRN Jearld Leschixon, Rashaun M, NP   2 mg at 03/06/19 1821  . omega-3 acid ethyl esters (LOVAZA) capsule 1 g  1 g Oral BID Denzil Magnusonhomas, Lashunda, NP   1 g at 03/07/19 1004  . paliperidone (INVEGA SUSTENNA) injection 234 mg  234 mg Intramuscular Once Malvin JohnsFarah, Brian, MD      . risperiDONE (RISPERDAL) tablet 6 mg  6 mg Oral QHS Malvin JohnsFarah, Brian, MD   6 mg at  03/06/19 2055  . sodium chloride (OCEAN) 0.65 % nasal spray 1 spray  1 spray Each Nare PRN Nira ConnBerry, Jason A, NP   1 spray at 02/28/19 2244  . temazepam (RESTORIL) capsule 30 mg  30 mg Oral QHS Malvin JohnsFarah, Brian, MD   30 mg at 03/06/19 2056   Current Outpatient Medications  Medication Sig Dispense Refill  . divalproex (DEPAKOTE) 250 MG DR tablet Take 1 tablet (250 mg) by mouth in the morning & 2 tablets (500 mg) at bedtime: For mood stabilization 90 tablet 0  . lithium carbonate 150 MG capsule Take 3 capsules (450 mg total) by mouth at bedtime. For mood stabilization 90 capsule 0  . nicotine polacrilex (NICORETTE) 2 MG gum Take 1 each (2 mg total) by mouth as needed. (May buy from over the counter): For smoking cessation 100 tablet 0  . omega-3 acid ethyl esters (LOVAZA) 1 g capsule Take 1 capsule (1 g total) by mouth 2 (two) times daily. (Patient not taking: Reported on 02/18/2019) 60 capsule 1  . [START ON 03/18/2019] paliperidone (INVEGA SUSTENNA) 234 MG/1.5ML SUSY injection Inject 234 mg into the muscle once for 1 dose. (Due on 03-18-19): For mood control 1.5 mL 0  .  risperiDONE (RISPERDAL) 3 MG tablet Take 2 tablets (6 mg total) by mouth at bedtime. For mood control 60 tablet 0  . sodium chloride (OCEAN) 0.65 % SOLN nasal spray Place 1 spray into both nostrils as needed for congestion.  0  . temazepam (RESTORIL) 30 MG capsule Take 1 capsule (30 mg total) by mouth at bedtime. For sleep 30 capsule 0   PTA Medications: No medications prior to admission.    Patient Stressors: Substance abuse Traumatic event  Patient Strengths: Ability for insight Communication skills General fund of knowledge Supportive family/friends  Treatment Modalities: Medication Management, Group therapy, Case management,  1 to 1 session with clinician, Psychoeducation, Recreational therapy.   Physician Treatment Plan for Primary Diagnosis: Bipolar I disorder ([place]) Long Term Goal(s): Improvement in symptoms so as ready  for discharge Improvement in symptoms so as ready for discharge   Short Term Goals: Ability to identify changes in lifestyle to reduce recurrence of condition will improve Ability to verbalize feelings will improve Ability to disclose and discuss suicidal ideas Ability to demonstrate self-control will improve Ability to identify and develop effective coping behaviors will improve Ability to verbalize feelings will improve Ability to disclose and discuss suicidal ideas Ability to demonstrate self-control will improve Ability to identify and develop effective coping behaviors will improve  Medication Management: Evaluate patient's response, side effects, and tolerance of medication regimen.  Therapeutic Interventions: 1 to 1 sessions, Unit Group sessions and Medication administration.  Evaluation of Outcomes: Adequate for Discharge  Physician Treatment Plan for Secondary Diagnosis: Principal Problem:   Bipolar I disorder ([place]) Active Problems:   Psychosis ([place])  Long Term Goal(s): Improvement in symptoms so as ready for discharge Improvement in symptoms so as ready for discharge   Short Term Goals: Ability to identify changes in lifestyle to reduce recurrence of condition will improve Ability to verbalize feelings will improve Ability to disclose and discuss suicidal ideas Ability to demonstrate self-control will improve Ability to identify and develop effective coping behaviors will improve Ability to verbalize feelings will improve Ability to disclose and discuss suicidal ideas Ability to demonstrate self-control will improve Ability to identify and develop effective coping behaviors will improve     Medication Management: Evaluate patient's response, side effects, and tolerance of medication regimen.  Therapeutic Interventions: 1 to 1 sessions, Unit Group sessions and Medication administration.  Evaluation of Outcomes: Adequate for Discharge   RN Treatment Plan for  Primary Diagnosis: Bipolar I disorder ([place]) Long Term Goal(s): Knowledge of disease and therapeutic regimen to maintain health will improve  Short Term Goals: Ability to participate in decision making will improve, Ability to verbalize feelings will improve, Ability to disclose and discuss suicidal ideas, Ability to identify and develop effective coping behaviors will improve and Compliance with prescribed medications will improve  Medication Management: RN will administer medications as ordered by provider, will assess and evaluate patient's response and provide education to patient for prescribed medication. RN will report any adverse and/or side effects to prescribing provider.  Therapeutic Interventions: 1 on 1 counseling sessions, Psychoeducation, Medication administration, Evaluate responses to treatment, Monitor vital signs and CBGs as ordered, Perform/monitor CIWA, COWS, AIMS and Fall Risk screenings as ordered, Perform wound care treatments as ordered.  Evaluation of Outcomes: Adequate for Discharge   LCSW Treatment Plan for Primary Diagnosis: Bipolar I disorder ([place]) Long Term Goal(s): Safe transition to appropriate next level of care at discharge, Engage patient in therapeutic group addressing interpersonal concerns.  Short Term Goals: Engage patient in  aftercare planning with referrals and resources and Increase skills for wellness and recovery  Therapeutic Interventions: Assess for all discharge needs, 1 to 1 time with Social worker, Explore available resources and support systems, Assess for adequacy in community support network, Educate family and significant other(s) on suicide prevention, Complete Psychosocial Assessment, Interpersonal group therapy.  Evaluation of Outcomes: Adequate for Discharge   Progress in Treatment: Attending groups: Yes. Participating in groups: Yes. Taking medication as prescribed: Yes. Toleration medication: Yes. Family/Significant other contact  made: Yes, individual(s) contacted:  pt's mother Patient understands diagnosis: No. Discussing patient identified problems/goals with staff: Yes. Medical problems stabilized or resolved: Yes. Denies suicidal/homicidal ideation: Yes. Issues/concerns per patient self-inventory: No. Other:   New problem(s) identified: No, Describe:  None  New Short Term/Long Term Goal(s): Medication stabilization, elimination of SI thoughts, and development of a comprehensive mental wellness plan.   Patient Goals:  "Move forward"   Discharge Plan or Barriers: Patient will be discharging home with his parents. Patient has agreed to follow their contract to stay in their home. [facility] [place] [facility], and eventually an intensive case management program.   Reason for Continuation of Hospitalization: Patient is discharging today.   Estimated Length of Stay: Patient is discharging today.   Attendees: Patient:  03/07/2019   Physician: Dr. Malvin Johns, MD 03/07/2019   Nursing: Marton Redwood, RN 03/07/2019   RN Care Manager: 03/07/2019   Social Worker: Stephannie Peters, LCSW  03/07/2019  Recreational Therapist:  03/07/2019   Other:  03/07/2019  Other:  03/07/2019   Other: 03/07/2019     Scribe for Treatment Team: Delphia Grates, LCSW 03/07/2019 2:00 PM

## 2019-03-07 NOTE — Plan of Care (Signed)
Pt was able to show some improvement in communication during group sessions.   Victorino Sparrow, LRT/CTRS

## 2019-06-11 IMAGING — CR DG HAND COMPLETE 3+V*R*
3 series · 3 of 3 positions shown · non-contrast
Comparison: None.

CLINICAL DATA: Lacerations to right hand

EXAM:
RIGHT HAND - COMPLETE 3+ VIEW

[x hand pa right]
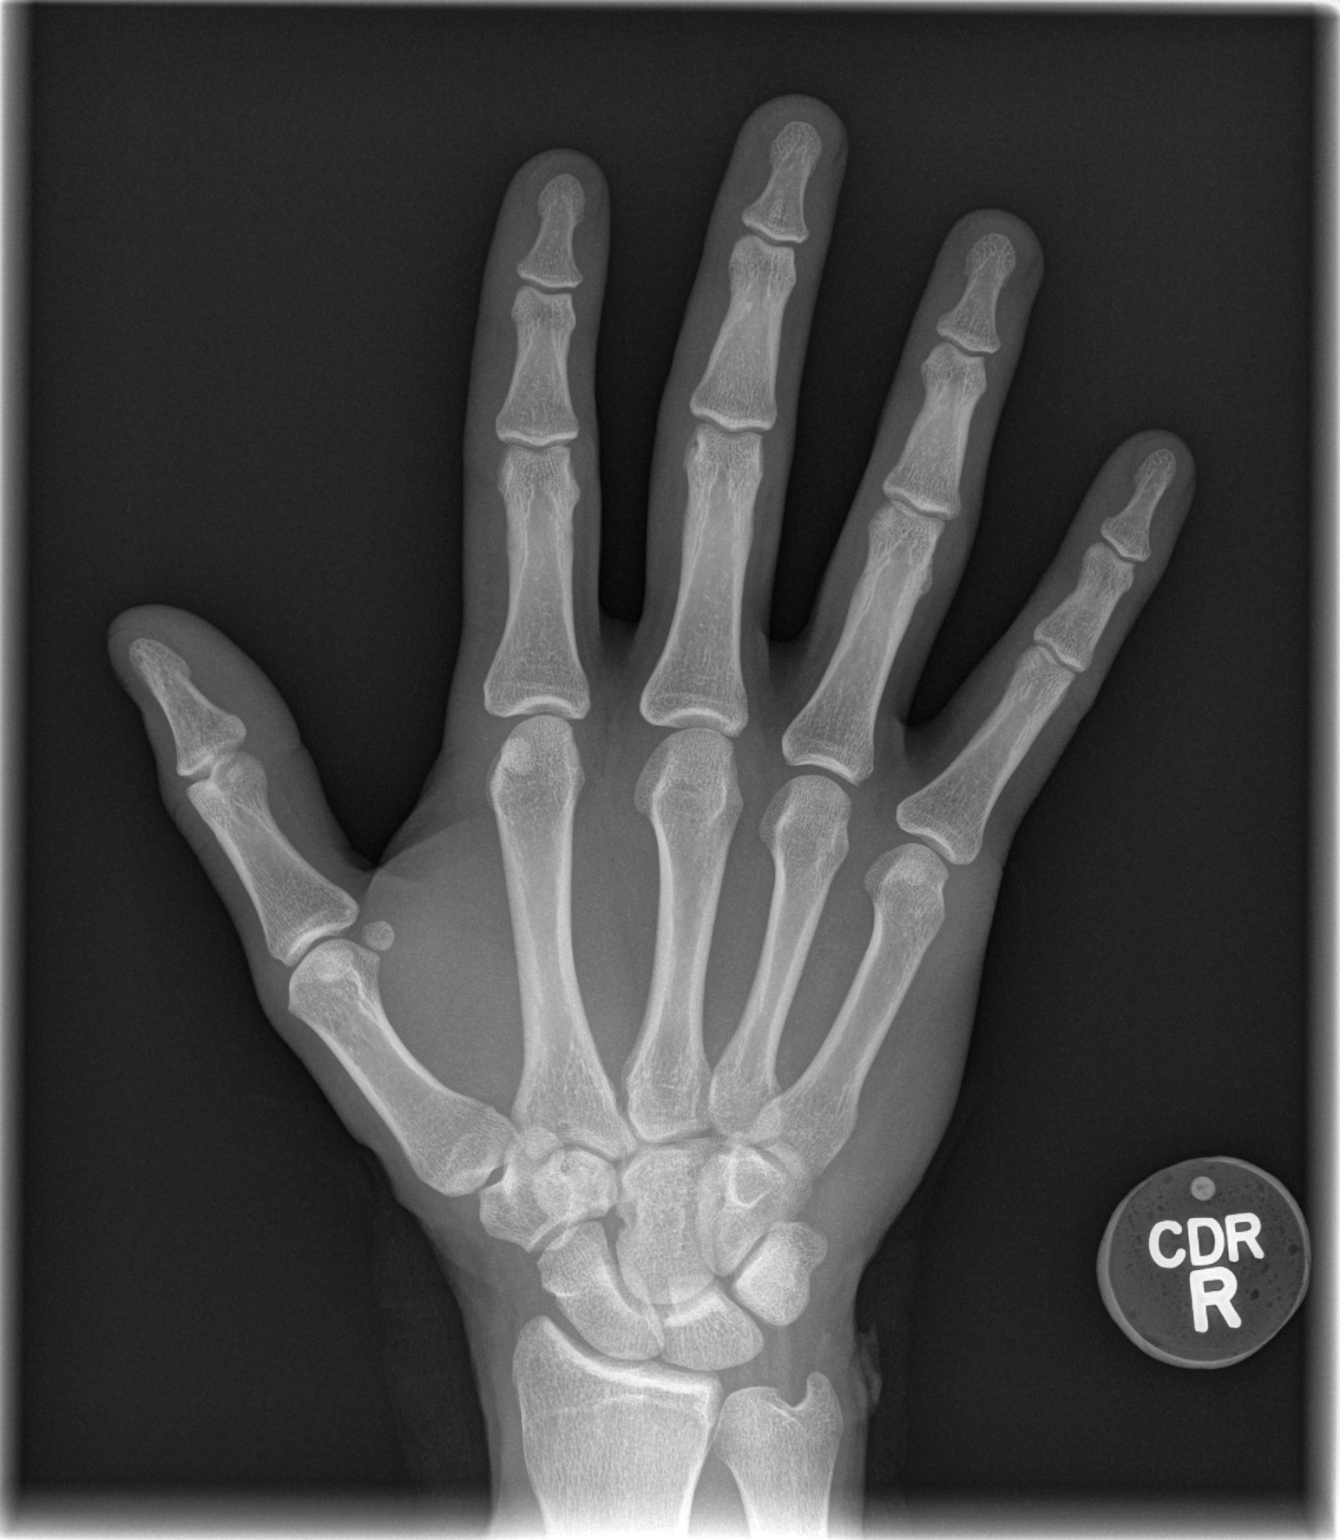

[x hand oblique right]
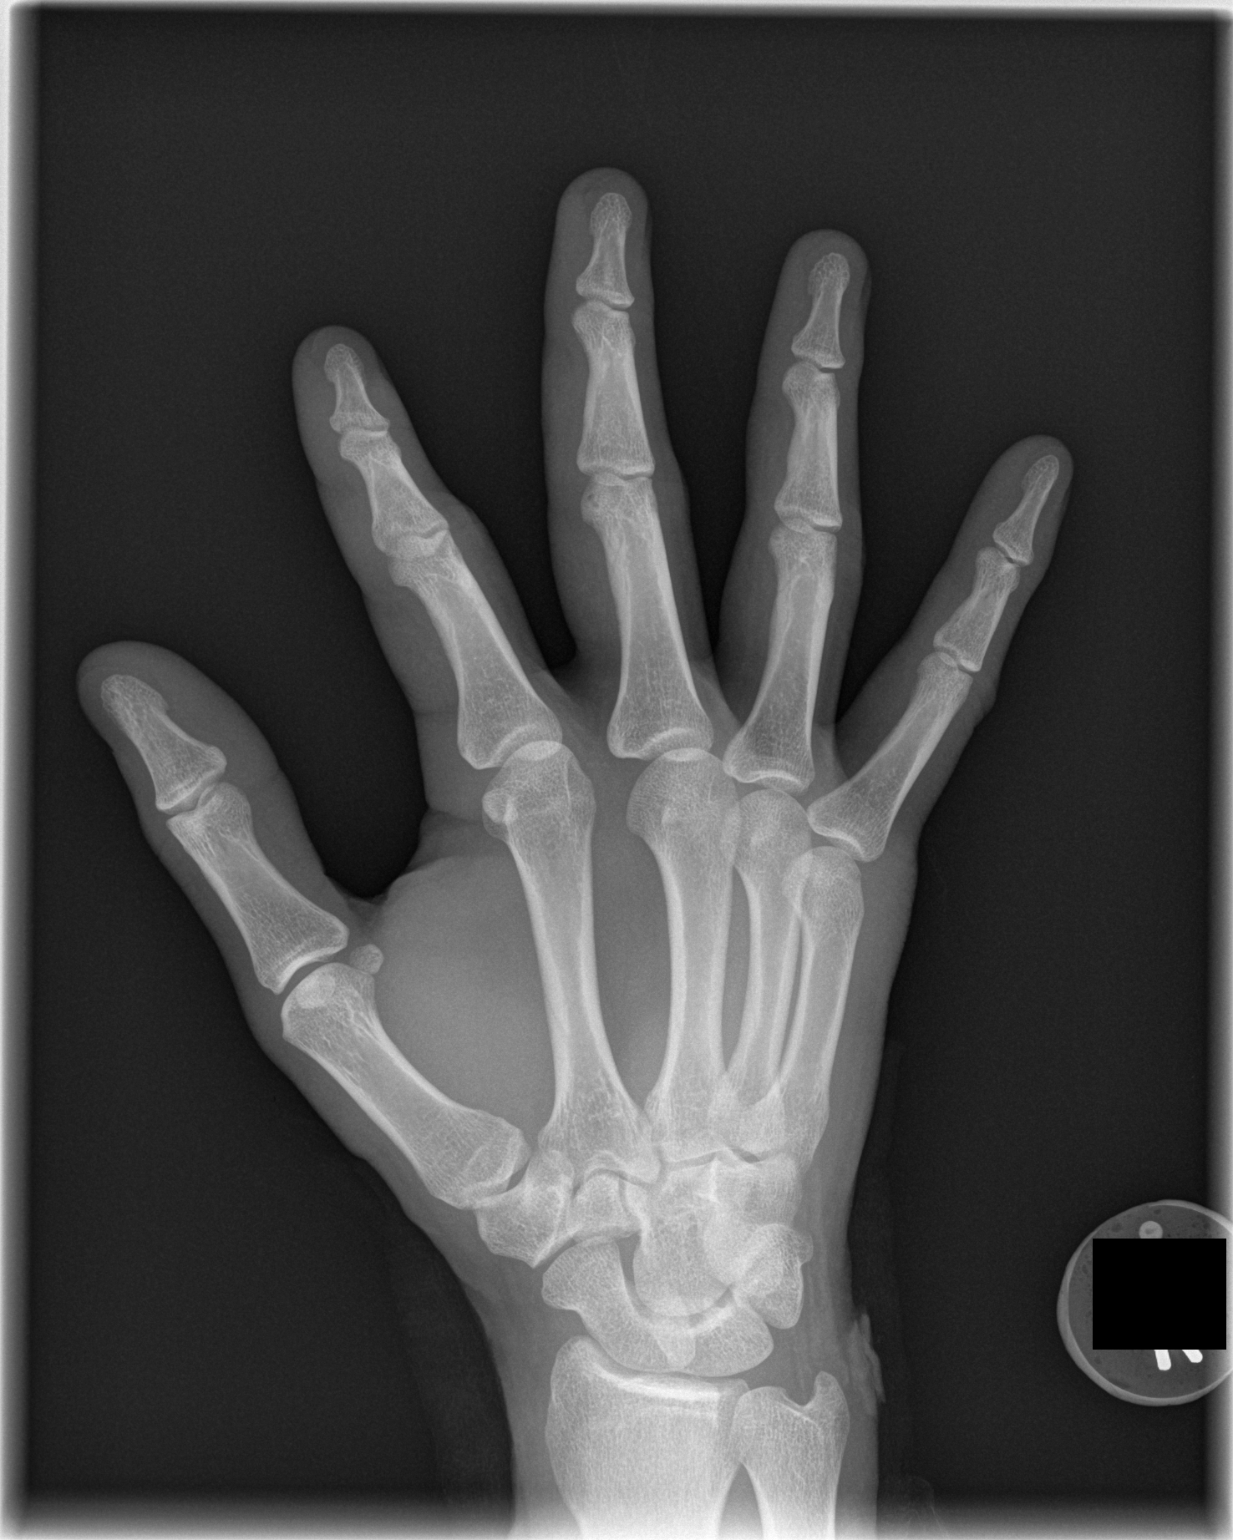

[x hand lat right]
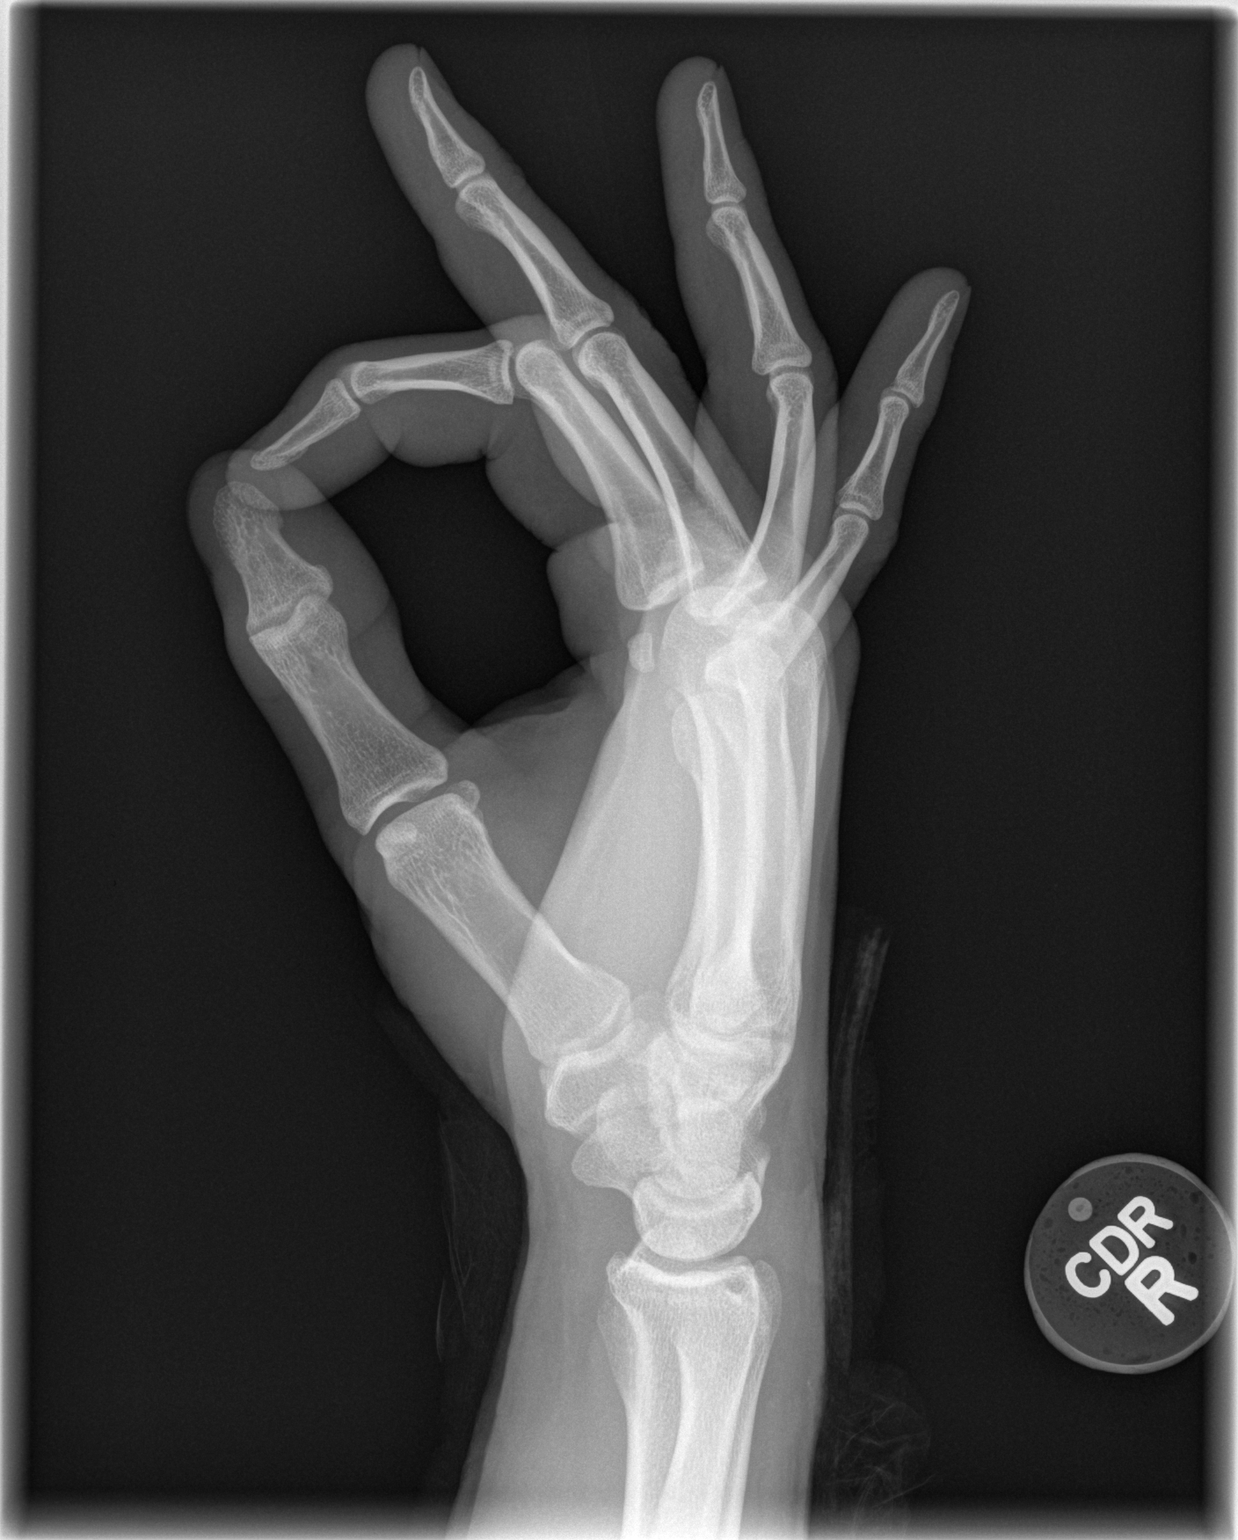

[3 of 3 positions shown; findings below may reference images not displayed]

FINDINGS: There is no evidence of fracture or dislocation. There is no
evidence of arthropathy or other focal bone abnormality. Soft
tissues are unremarkable. No radiopaque foreign body.
IMPRESSION: No radiopaque foreign body or osseous injury.

## 2020-12-30 ENCOUNTER — Emergency Department (HOSPITAL_BASED_OUTPATIENT_CLINIC_OR_DEPARTMENT_OTHER)
Admission: EM | Admit: 2020-12-30 | Discharge: 2020-12-30 | Disposition: A | Discharge status: Home / Self Care | Payer: No Typology Code available for payment source | Attending: Emergency Medicine | Admitting: Emergency Medicine

## 2020-12-30 ENCOUNTER — Other Ambulatory Visit: Payer: Self-pay

## 2020-12-30 ENCOUNTER — Encounter (HOSPITAL_BASED_OUTPATIENT_CLINIC_OR_DEPARTMENT_OTHER): Payer: Self-pay | Admitting: *Deleted

## 2020-12-30 DIAGNOSIS — G8929 Other chronic pain: Secondary | ICD-10-CM | POA: Diagnosis not present

## 2020-12-30 DIAGNOSIS — R519 Headache, unspecified: Secondary | ICD-10-CM | POA: Diagnosis not present

## 2020-12-30 DIAGNOSIS — F17210 Nicotine dependence, cigarettes, uncomplicated: Secondary | ICD-10-CM | POA: Insufficient documentation

## 2020-12-30 DIAGNOSIS — M7918 Myalgia, other site: Secondary | ICD-10-CM | POA: Diagnosis not present

## 2020-12-30 NOTE — ED Provider Notes (Signed)
[facility] EMERGENCY DEPARTMENT Provider Note   CSN: [id number] Arrival date & time: 12/30/20  1717     History No chief complaint on file.   Zachary Eaton is a 35 y.o. male presenting for evaluation of HA and body aches.   Patient states he has had generalized body aches and headache since 2013.  He was being treated with Vicodin at 1 point and states this fixed his symptoms.  However his primary care doctor from the [place] told him several years ago that they would no longer prescribe long-term opioids for him.  As such, he has not followed up with anyone for further care.  He is here today because he reports his pain is worsening.  He has been self-medicating with alcohol and delta eights, but states pain is becoming worse.  He is not taking anything else including Tylenol ibuprofen.  He denies fevers, chills, nasal congestion, cough, chest pain, shortness of breath, nausea, vomiting, urinary symptoms, abnormal bowel movements.  States he takes no medications daily.  Additional history obtained from chart review.  Per chart review, patient with a history of migraines, TBI, bipolar, PTSD, depression.  HPI     Past Medical History:  Diagnosis Date   Migraines    Traumatic brain injury [facility])     Patient Active Problem List   Diagnosis Date Noted   Psychosis (HCC) 02/17/2019   Bipolar I disorder (HCC) 06/22/2018   PTSD (post-traumatic stress disorder)    MDD (major depressive disorder), severe (HCC) 06/12/2018    Past Surgical History:  Procedure Laterality Date   ANKLE SURGERY Left        No family history on file.  Social History   Tobacco Use   Smoking status: Some Days    Packs/day: 2.00    Types: Cigarettes   Smokeless tobacco: Current    Types: Chew   Tobacco comments:    patient declined  Vaping Use   Vaping Use: Some days  Substance Use Topics   Alcohol use: Yes    Comment: occ   Drug use: Not Currently    Home Medications Prior to  Admission medications   Medication Sig Start Date End Date Taking? Authorizing Provider  divalproex (DEPAKOTE) 250 MG DR tablet Take 1 tablet (250 mg) by mouth in the morning & 2 tablets (500 mg) at bedtime: For mood stabilization 03/07/19   Armandina Stammer I, NP  lithium carbonate 150 MG capsule Take 3 capsules (450 mg total) by mouth at bedtime. For mood stabilization 03/07/19   Armandina Stammer I, NP  nicotine polacrilex (NICORETTE) 2 MG gum Take 1 each (2 mg total) by mouth as needed. (May buy from over the counter): For smoking cessation 03/07/19   Armandina Stammer I, NP  omega-3 acid ethyl esters (LOVAZA) 1 g capsule Take 1 capsule (1 g total) by mouth 2 (two) times daily. Patient not taking: Reported on 02/18/2019 06/26/18   Malvin Johns, MD  paliperidone [facility] SUSTENNA) 234 MG/1.5ML SUSY injection Inject 234 mg into the muscle once for 1 dose. (Due on 03-18-19): For mood control 03/18/19 03/18/19  Armandina Stammer I, NP  risperiDONE (RISPERDAL) 3 MG tablet Take 2 tablets (6 mg total) by mouth at bedtime. For mood control 03/07/19   Armandina Stammer I, NP  sodium chloride (OCEAN) 0.65 % SOLN nasal spray Place 1 spray into both nostrils as needed for congestion. 03/07/19   Armandina Stammer I, NP  temazepam (RESTORIL) 30 MG capsule Take 1 capsule (30  mg total) by mouth at bedtime. For sleep 03/07/19   Armandina Stammer I, NP    Allergies    Penicillins  Review of Systems   Review of Systems  Musculoskeletal:  Positive for myalgias.  Neurological:  Positive for headaches.   Physical Exam Updated Vital Signs BP 116/74 (BP Location: Right Arm)   Pulse 85   Temp 97.6 F (36.4 C) (Oral)   Resp 14   Ht 5\' 11"  (1.803 m)   Wt 79.4 kg   SpO2 100%   BMI 24.41 kg/m   Physical Exam Vitals and nursing note reviewed.  Constitutional:      General: He is not in acute distress.    Appearance: He is well-developed.  HENT:     Head: Normocephalic and atraumatic.  Eyes:     Extraocular Movements: Extraocular movements  intact.  Cardiovascular:     Rate and Rhythm: Normal rate.  Pulmonary:     Effort: Pulmonary effort is normal.  Abdominal:     General: There is no distension.  Musculoskeletal:        General: Normal range of motion.     Cervical back: Normal range of motion.  Skin:    General: Skin is warm.     Findings: No rash.  Neurological:     Mental Status: He is alert and oriented to person, place, and time.    ED Results / Procedures / Treatments   Labs (all labs ordered are listed, but only abnormal results are displayed) Labs Reviewed - No data to display  EKG None  Radiology No results found.  Procedures Procedures   Medications Ordered in ED Medications - No data to display  ED Course  I have reviewed the triage vital signs and the nursing notes.  Pertinent labs & imaging results that were available during my care of the patient were reviewed by me and considered in my medical decision making (see chart for details).    MDM Rules/Calculators/A&P                           Patient presenting for evaluation of headaches and body aches since 2013.  He is requesting Vicodin.  On exam, patient appears nontoxic.  I discussed that we do not prescribe chronic pain medication in the ED.  I offered lab work to assess electrolytes and kidney function, patient declined.  I offered alternative medications including Toradol, patient declined.  I recommended patient follow-up with pain management, resources given.  At this time, patient appears safe for discharge.  Return precautions given.  Patient states he understands and agrees to plan.  Final Clinical Impression(s) / ED Diagnoses Final diagnoses:  Chronic generalized pain  Chronic intractable headache, unspecified headache type    Rx / DC Orders ED Discharge Orders     None        2014, PA-C 12/30/20 1911    01/01/21, DO 12/30/20 2207

## 2020-12-30 NOTE — ED Triage Notes (Signed)
Headache and body aches since 2013. States this is not new but [facility] not [facility].

## 2020-12-30 NOTE — Discharge Instructions (Addendum)
I recommend you take medicine such as Tylenol ibuprofen as needed for pain. Make sure stay well-hydrated water. Call either or both of the pain clinics listed below to see if you can set up a follow-up appointment. If not, you can look online for other local pain clinics that may be able to help you.  Return to the emergency room with any new, worsening, concerning symptoms.

## 2021-12-07 ENCOUNTER — Other Ambulatory Visit: Payer: Self-pay

## 2021-12-07 ENCOUNTER — Encounter (HOSPITAL_BASED_OUTPATIENT_CLINIC_OR_DEPARTMENT_OTHER): Payer: Self-pay

## 2021-12-07 ENCOUNTER — Emergency Department (HOSPITAL_BASED_OUTPATIENT_CLINIC_OR_DEPARTMENT_OTHER)
Admission: EM | Admit: 2021-12-07 | Discharge: 2021-12-07 | Disposition: A | Discharge status: Home / Self Care | Payer: No Typology Code available for payment source | Attending: Emergency Medicine | Admitting: Emergency Medicine

## 2021-12-07 ENCOUNTER — Emergency Department (HOSPITAL_BASED_OUTPATIENT_CLINIC_OR_DEPARTMENT_OTHER): Payer: No Typology Code available for payment source

## 2021-12-07 DIAGNOSIS — Z23 Encounter for immunization: Secondary | ICD-10-CM | POA: Diagnosis not present

## 2021-12-07 DIAGNOSIS — Y9389 Activity, other specified: Secondary | ICD-10-CM | POA: Diagnosis not present

## 2021-12-07 DIAGNOSIS — W268XXA Contact with other sharp object(s), not elsewhere classified, initial encounter: Secondary | ICD-10-CM | POA: Insufficient documentation

## 2021-12-07 DIAGNOSIS — S81811A Laceration without foreign body, right lower leg, initial encounter: Secondary | ICD-10-CM | POA: Insufficient documentation

## 2021-12-07 DIAGNOSIS — S8991XA Unspecified injury of right lower leg, initial encounter: Secondary | ICD-10-CM | POA: Diagnosis present

## 2021-12-07 MED ORDER — TETANUS-DIPHTH-ACELL PERTUSSIS 5-2.5-18.5 LF-MCG/0.5 IM SUSY
0.5000 mL | PREFILLED_SYRINGE | Freq: Once | INTRAMUSCULAR | Status: AC
  Administered 2021-12-07: 0.5 mL via INTRAMUSCULAR
  Filled 2021-12-07: qty 0.5

## 2021-12-07 MED ORDER — LIDOCAINE-EPINEPHRINE (PF) 2 %-1:200000 IJ SOLN
10.0000 mL | Freq: Once | INTRAMUSCULAR | Status: AC
  Administered 2021-12-07: 10 mL
  Filled 2021-12-07: qty 20

## 2021-12-07 NOTE — ED Notes (Signed)
Wound care per this RN. Tolerated well.

## 2021-12-07 NOTE — ED Provider Notes (Signed)
[facility] EMERGENCY DEPT Provider Note   CSN: [id number] Arrival date & time: 12/07/21  1939     History  Chief Complaint  Patient presents with   Leg Injury    Zachary Eaton is a 36 y.o. male.  Patient is a 36 year old male presenting today after an injury to his right lower leg.  He was using some of his machete's and he had not regrouped it yet and it slipped out of his hand cutting his leg.  He had a lot of bleeding from the leg and put on a tourniquet and came here.  He denies any numbness or tingling in his foot.  He is able to completely bend his knee.  No other areas of injury.  Last tetanus shot was in 2016.  The history is provided by the patient.       Home Medications Prior to Admission medications   Medication Sig Start Date End Date Taking? Authorizing Provider  divalproex (DEPAKOTE) 250 MG DR tablet Take 1 tablet (250 mg) by mouth in the morning & 2 tablets (500 mg) at bedtime: For mood stabilization 03/07/19   Armandina Stammer I, NP  lithium carbonate 150 MG capsule Take 3 capsules (450 mg total) by mouth at bedtime. For mood stabilization 03/07/19   Armandina Stammer I, NP  nicotine polacrilex (NICORETTE) 2 MG gum Take 1 each (2 mg total) by mouth as needed. (May buy from over the counter): For smoking cessation 03/07/19   Armandina Stammer I, NP  omega-3 acid ethyl esters (LOVAZA) 1 g capsule Take 1 capsule (1 g total) by mouth 2 (two) times daily. Patient not taking: Reported on 02/18/2019 06/26/18   Malvin Johns, MD  paliperidone [facility] SUSTENNA) 234 MG/1.5ML SUSY injection Inject 234 mg into the muscle once for 1 dose. (Due on 03-18-19): For mood control 03/18/19 03/18/19  Armandina Stammer I, NP  risperiDONE (RISPERDAL) 3 MG tablet Take 2 tablets (6 mg total) by mouth at bedtime. For mood control 03/07/19   Armandina Stammer I, NP  sodium chloride (OCEAN) 0.65 % SOLN nasal spray Place 1 spray into both nostrils as needed for congestion. 03/07/19   Armandina Stammer I, NP   temazepam (RESTORIL) 30 MG capsule Take 1 capsule (30 mg total) by mouth at bedtime. For sleep 03/07/19   Armandina Stammer I, NP      Allergies    Penicillins    Review of Systems   Review of Systems  Physical Exam Updated Vital Signs BP 139/87 (BP Location: Right Arm)   Pulse 74   Temp 98.2 F (36.8 C)   Resp 17   Ht 5\' 11"  (1.803 m)   Wt 79.4 kg   SpO2 100%   BMI 24.41 kg/m  Physical Exam Vitals and nursing note reviewed.  HENT:     Head: Normocephalic.  Cardiovascular:     Rate and Rhythm: Normal rate.     Pulses: Normal pulses.  Pulmonary:     Effort: Pulmonary effort is normal.  Musculoskeletal:        General: Tenderness present.     Right knee: Normal.       Legs:     Comments: Normal sensation and movement of the ankle and the toes on the right foot  Skin:    General: Skin is warm and dry.  Neurological:     Mental Status: He is alert. Mental status is at baseline.     ED Results / Procedures / Treatments   Labs (  all labs ordered are listed, but only abnormal results are displayed) Labs Reviewed - No data to display  EKG None  Radiology DG Tibia/Fibula Right  Result Date: 12/07/2021 CLINICAL DATA:  R upper leg laceration via machete. EXAM: RIGHT TIBIA AND FIBULA - 2 VIEW COMPARISON:  None Available. FINDINGS: There is no evidence of fracture or other focal bone lesions. Soft tissues are unremarkable. No retained radiopaque foreign body. IMPRESSION: Negative. Electronically Signed   By: Tish Frederickson M.D.   On: 12/07/2021 20:22    Procedures Procedures   LACERATION REPAIR Performed by: [facility] Authorized by: Gwyneth Sprout Consent: Verbal consent obtained. Risks and benefits: risks, benefits and alternatives were discussed Consent given by: patient Patient identity confirmed: provided demographic data Prepped and Draped in normal sterile fashion Wound explored  Laceration Location: right proximal tib/fib  Laceration Length:  2cm  No Foreign Bodies seen or palpated  Anesthesia: local infiltration  Local anesthetic: lidocaine 2% with epinephrine  Anesthetic total: 3 ml  Irrigation method: syringe Amount of cleaning: standard  Skin closure: staples  Number of sutures: 3  Technique: staples  Patient tolerance: Patient tolerated the procedure well with no immediate complications.   Medications Ordered in ED Medications  lidocaine-EPINEPHrine (XYLOCAINE W/EPI) 2 %-1:200000 (PF) injection 10 mL (has no administration in time range)    ED Course/ Medical Decision Making/ A&P                           Medical Decision Making Amount and/or Complexity of Data Reviewed Radiology: ordered.  Risk Prescription drug management.   patient presenting today with laceration to the leg.  He has no underlying vascular injury and low suspicion for tendon injury.  He has full range of motion in his knee with no findings concerning for an open joint. I have independently visualized and interpreted pt's images today. Plain films neg for acute injury.  Laceration repaired as above.  Tetanus updated.  Patient stable for discharge home.         Final Clinical Impression(s) / ED Diagnoses Final diagnoses:  Laceration of right lower leg, initial encounter    Rx / DC Orders ED Discharge Orders     None         Gwyneth Sprout, MD 12/07/21 [phone]

## 2021-12-07 NOTE — ED Triage Notes (Signed)
Pt presents to the ED with a laceration to the right lower leg. States that he was "testing blades on machetes and forgot to put the grip on it and it slipped out of his hand" and went into his right leg. Pt states that it was the tip of the machete that went into his leg and it was jagged. Pt reports pulling it out and putting a pressure dressing in place. Bleeding controlled with pressure dressing. Pt A&Ox4 at time of triage. VSS.

## 2021-12-19 ENCOUNTER — Encounter (HOSPITAL_BASED_OUTPATIENT_CLINIC_OR_DEPARTMENT_OTHER): Payer: Self-pay | Admitting: Emergency Medicine

## 2021-12-19 ENCOUNTER — Emergency Department (HOSPITAL_BASED_OUTPATIENT_CLINIC_OR_DEPARTMENT_OTHER)
Admission: EM | Admit: 2021-12-19 | Discharge: 2021-12-19 | Disposition: A | Discharge status: Home / Self Care | Payer: No Typology Code available for payment source | Attending: Emergency Medicine | Admitting: Emergency Medicine

## 2021-12-19 ENCOUNTER — Other Ambulatory Visit: Payer: Self-pay

## 2021-12-19 DIAGNOSIS — R220 Localized swelling, mass and lump, head: Secondary | ICD-10-CM | POA: Diagnosis present

## 2021-12-19 DIAGNOSIS — T7840XA Allergy, unspecified, initial encounter: Secondary | ICD-10-CM | POA: Diagnosis not present

## 2021-12-19 MED ORDER — PREDNISONE 10 MG PO TABS: 20.0000 mg | ORAL_TABLET | Freq: Every day | ORAL | 0 refills | Status: AC

## 2021-12-19 MED ORDER — DIPHENHYDRAMINE HCL 25 MG PO TABS: 25.0000 mg | ORAL_TABLET | Freq: Four times a day (QID) | ORAL | 0 refills | Status: DC | PRN

## 2021-12-19 MED ORDER — DEXAMETHASONE SODIUM PHOSPHATE 10 MG/ML IJ SOLN
10.0000 mg | Freq: Once | INTRAMUSCULAR | Status: AC
  Administered 2021-12-19: 10 mg via INTRAMUSCULAR
  Filled 2021-12-19: qty 1

## 2021-12-19 MED ORDER — DIPHENHYDRAMINE HCL 25 MG PO CAPS
50.0000 mg | ORAL_CAPSULE | Freq: Once | ORAL | Status: AC
  Administered 2021-12-19: 50 mg via ORAL
  Filled 2021-12-19: qty 2

## 2021-12-19 NOTE — ED Triage Notes (Signed)
Pt thinks Bee sting to right forehead (pt was asleep when it happened.) good amount of swelling noted on both sides of face.

## 2021-12-19 NOTE — ED Notes (Signed)
Pt. Reports he was stung in the R eye by honey bee.  R eye is edematous.

## 2021-12-19 NOTE — ED Provider Notes (Signed)
[facility] EMERGENCY DEPARTMENT Provider Note   CSN: [id number] Arrival date & time: 12/19/21  2045     History  Chief Complaint  Patient presents with   Insect Bite   Facial Swelling    Zachary Eaton is a 36 y.o. male.  Patient states that Zachary Eaton thinks Zachary Eaton was stung by bee around 2 PM today.  This about 8 hours ago.  Zachary Eaton has had some swelling around his eyes since.  Has not taken anything for this.  No history of allergies.  Nothing makes it worse or better.  Zachary Eaton has no nausea or vomiting.  No difficulty breathing.  States that his eye has started to swell up slowly throughout the last several hours.  Patient is asking for some medication and would like to be discharged.  The history is provided by the patient.       Home Medications Prior to Admission medications   Medication Sig Start Date End Date Taking? Authorizing Provider  diphenhydrAMINE (BENADRYL) 25 MG tablet Take 1 tablet (25 mg total) by mouth every 6 (six) hours as needed for allergies. 12/19/21  Yes Quadasia Newsham, DO  predniSONE (DELTASONE) 10 MG tablet Take 2 tablets (20 mg total) by mouth daily for 4 days. 12/19/21 12/23/21 Yes Ivelise Castillo, DO  divalproex (DEPAKOTE) 250 MG DR tablet Take 1 tablet (250 mg) by mouth in the morning & 2 tablets (500 mg) at bedtime: For mood stabilization 03/07/19   Armandina Stammer I, NP  lithium carbonate 150 MG capsule Take 3 capsules (450 mg total) by mouth at bedtime. For mood stabilization 03/07/19   Armandina Stammer I, NP  nicotine polacrilex (NICORETTE) 2 MG gum Take 1 each (2 mg total) by mouth as needed. (May buy from over the counter): For smoking cessation 03/07/19   Armandina Stammer I, NP  omega-3 acid ethyl esters (LOVAZA) 1 g capsule Take 1 capsule (1 g total) by mouth 2 (two) times daily. Patient not taking: Reported on 02/18/2019 06/26/18   Malvin Johns, MD  paliperidone [facility] SUSTENNA) 234 MG/1.5ML SUSY injection Inject 234 mg into the muscle once for 1 dose. (Due on  03-18-19): For mood control 03/18/19 03/18/19  Armandina Stammer I, NP  risperiDONE (RISPERDAL) 3 MG tablet Take 2 tablets (6 mg total) by mouth at bedtime. For mood control 03/07/19   Armandina Stammer I, NP  sodium chloride (OCEAN) 0.65 % SOLN nasal spray Place 1 spray into both nostrils as needed for congestion. 03/07/19   Armandina Stammer I, NP  temazepam (RESTORIL) 30 MG capsule Take 1 capsule (30 mg total) by mouth at bedtime. For sleep 03/07/19   Armandina Stammer I, NP      Allergies    Penicillins    Review of Systems   Review of Systems  Physical Exam Updated Vital Signs BP 117/71   Pulse 73   Temp 97.8 F (36.6 C) (Oral)   Resp 17   Ht 5\' 11"  (1.803 m)   Wt 79.4 kg   SpO2 100%   BMI 24.41 kg/m  Physical Exam Vitals and nursing note reviewed.  Constitutional:      General: Zachary Eaton is not in acute distress.    Appearance: Zachary Eaton is well-developed.  HENT:     Head: Normocephalic and atraumatic.     Comments: There is swelling to the upper eyelids bilaterally    Nose: Nose normal.     Mouth/Throat:     Mouth: Mucous membranes are moist.  Eyes:  Extraocular Movements: Extraocular movements intact.     Conjunctiva/sclera: Conjunctivae normal.     Pupils: Pupils are equal, round, and reactive to light.  Cardiovascular:     Rate and Rhythm: Normal rate and regular rhythm.     Pulses: Normal pulses.     Heart sounds: Normal heart sounds. No murmur heard. Pulmonary:     Effort: Pulmonary effort is normal. No respiratory distress.     Breath sounds: Normal breath sounds.  Abdominal:     Palpations: Abdomen is soft.     Tenderness: There is no abdominal tenderness.  Musculoskeletal:        General: No swelling.     Cervical back: Neck supple.  Skin:    General: Skin is warm and dry.     Capillary Refill: Capillary refill takes less than 2 seconds.     Findings: No erythema or rash.  Neurological:     Mental Status: Zachary Eaton is alert.  Psychiatric:        Mood and Affect: Mood normal.      ED Results / Procedures / Treatments   Labs (all labs ordered are listed, but only abnormal results are displayed) Labs Reviewed - No data to display  EKG None  Radiology No results found.  Procedures Procedures    Medications Ordered in ED Medications  dexamethasone (DECADRON) injection 10 mg (10 mg Intramuscular Given 12/19/21 2151)  diphenhydrAMINE (BENADRYL) capsule 50 mg (50 mg Oral Given 12/19/21 2151)    ED Course/ Medical Decision Making/ A&P                           Medical Decision Making Risk OTC drugs. Prescription drug management.   Zachary Eaton is here with allergic reaction to suspected insect bite.  Zachary Eaton has some swelling to the upper eyelids bilaterally that appears consistent with an allergic reaction.  Zachary Eaton has no signs of anaphylaxis.  Zachary Eaton has a history of penicillin allergy.  Denies any nausea or vomiting.  Zachary Eaton has no hives.  Overall appears to have a mild allergic reaction.  We will give him Benadryl and Decadron.  Zachary Eaton states that over the last 8 hours the swelling really has not gotten much worse.  Zachary Eaton would like his medication in the go home.  However I did recommend that we watch him for some time after antihistamines.  Patient is requesting to leave which I think is reasonable.  His swelling has really been stable for several hours at this time.  I am not concerned for any anaphylaxis.  Will prescribe prednisone and Benadryl.  Understands return precautions.  This chart was dictated using voice recognition software.  Despite best efforts to proofread,  errors can occur which can change the documentation meaning.         Final Clinical Impression(s) / ED Diagnoses Final diagnoses:  Allergic reaction, initial encounter    Rx / DC Orders ED Discharge Orders          Ordered    predniSONE (DELTASONE) 10 MG tablet  Daily        12/19/21 2228    diphenhydrAMINE (BENADRYL) 25 MG tablet  Every 6 hours PRN        12/19/21 2228               Virgina Norfolk, DO 12/19/21 2228

## 2023-11-04 ENCOUNTER — Other Ambulatory Visit: Payer: Self-pay

## 2023-11-04 ENCOUNTER — Encounter (HOSPITAL_COMMUNITY): Payer: Self-pay

## 2023-11-04 ENCOUNTER — Emergency Department (HOSPITAL_COMMUNITY)
Admission: EM | Admit: 2023-11-04 | Discharge: 2023-11-04 | Disposition: A | Discharge status: Home / Self Care | Attending: Emergency Medicine | Admitting: Emergency Medicine

## 2023-11-04 DIAGNOSIS — H5789 Other specified disorders of eye and adnexa: Secondary | ICD-10-CM | POA: Diagnosis present

## 2023-11-04 MED ORDER — FLUORESCEIN SODIUM 1 MG OP STRP
1.0000 | ORAL_STRIP | Freq: Once | OPHTHALMIC | Status: AC
Start: 2023-11-04 — End: 2023-11-04
  Administered 2023-11-04: 1 via OPHTHALMIC
  Filled 2023-11-04: qty 1

## 2023-11-04 MED ORDER — TETRACAINE HCL 0.5 % OP SOLN
2.0000 [drp] | Freq: Once | OPHTHALMIC | Status: AC
Start: 2023-11-04 — End: 2023-11-04
  Administered 2023-11-04: 2 [drp] via OPHTHALMIC
  Filled 2023-11-04: qty 4
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
## 2023-11-04 NOTE — Discharge Instructions (Signed)
 Thank you for allowing us  to take care of you today.  We hope you begin feeling better soon.  To-Do: Please follow-up with your primary doctor. Try not to mess with your eye as this may cause further irritation. Please return to the Emergency Department or call 911 if you experience chest pain, shortness of breath, severe pain, severe fever, altered mental status, or have any reason to think that you need emergency medical care.  Thank you again.  Hope you feel better soon.  Department of Emergency Medicine [facility]

## 2023-11-04 NOTE — ED Provider Notes (Signed)
 [place] EMERGENCY DEPARTMENT AT [facility] Provider Note  CSN: [id number] Arrival date & time: 11/04/23 9395  Chief Complaint(s) Eye Problem  HPI Zachary Eaton is a 38 y.o. male with a past medical history listed below who presents to the emergency department with eye orientation.  Patient reports that he felt his right eyelid moving while watching for sees an episode of the X files,  the one with the brain worm.  He began to worry that there was a worm in his eye when he looked in saw something.  Then he thought there might have been a tick in his eye. Currently the eye feel irritated from all the rubbing.  He denies any hallucinations.  No suicidal or homicidal ideations  The history is provided by the patient.    Past Medical History Past Medical History:  Diagnosis Date   Migraines    Traumatic brain injury [facility])    Patient Active Problem List   Diagnosis Date Noted   Psychosis (HCC) 02/17/2019   Bipolar I disorder (HCC) 06/22/2018   PTSD (post-traumatic stress disorder)    MDD (major depressive disorder), severe (HCC) 06/12/2018   Home Medication(s) Prior to Admission medications   Medication Sig Start Date End Date Taking? Authorizing Provider  diphenhydrAMINE  (BENADRYL ) 25 MG tablet Take 1 tablet (25 mg total) by mouth every 6 (six) hours as needed for allergies. 12/19/21   Curatolo, Adam, DO  divalproex  (DEPAKOTE ) 250 MG DR tablet Take 1 tablet (250 mg) by mouth in the morning & 2 tablets (500 mg) at bedtime: For mood stabilization 03/07/19   Collene Gouge I, NP  lithium  carbonate 150 MG capsule Take 3 capsules (450 mg total) by mouth at bedtime. For mood stabilization 03/07/19   Collene Gouge I, NP  nicotine  polacrilex (NICORETTE ) 2 MG gum Take 1 each (2 mg total) by mouth as needed. (May buy from over the counter): For smoking cessation 03/07/19   Collene Gouge I, NP  omega-3 acid ethyl esters (LOVAZA ) 1 g capsule Take 1 capsule (1 g total) by mouth 2  (two) times daily. Patient not taking: Reported on 02/18/2019 06/26/18   Malinda Rogue, MD  paliperidone  (INVEGA  SUSTENNA) 234 MG/1.5ML SUSY injection Inject 234 mg into the muscle once for 1 dose. (Due on 03-18-19): For mood control 03/18/19 03/18/19  Collene Gouge I, NP  risperiDONE  (RISPERDAL ) 3 MG tablet Take 2 tablets (6 mg total) by mouth at bedtime. For mood control 03/07/19   Collene Gouge I, NP  sodium chloride  (OCEAN) 0.65 % SOLN nasal spray Place 1 spray into both nostrils as needed for congestion. 03/07/19   Collene Gouge I, NP  temazepam  (RESTORIL ) 30 MG capsule Take 1 capsule (30 mg total) by mouth at bedtime. For sleep 03/07/19   Collene Gouge FERNS, NP  Allergies Penicillins  Review of Systems Review of Systems As noted in HPI  Physical Exam Vital Signs  I have reviewed the triage vital signs BP (!) 133/91   Pulse 85   Temp 98 F (36.7 C)   Resp 15   SpO2 100%   Physical Exam Vitals reviewed.  Constitutional:      General: He is not in acute distress.    Appearance: He is well-developed. He is not diaphoretic.  HENT:     Head: Normocephalic and atraumatic.     Right Ear: External ear normal.     Left Ear: External ear normal.     Nose: Nose normal.     Mouth/Throat:     Mouth: Mucous membranes are moist.   Eyes:     General: No scleral icterus.    Extraocular Movements: Extraocular movements intact.     Conjunctiva/sclera:     Right eye: Right conjunctiva is injected. No hemorrhage.    Left eye: No hemorrhage.    Pupils: Pupils are equal.     Right eye: Pupil is reactive. No corneal abrasion or fluorescein uptake.     Left eye: Pupil is reactive.     Slit lamp exam:    Right eye: Foreign body (2 approx 1-21mm sized rheum removed) present.      Comments: Lids everted and swept  Neck:     Trachea: Phonation normal.   Cardiovascular:      Rate and Rhythm: Normal rate and regular rhythm.  Pulmonary:     Effort: Pulmonary effort is normal. No respiratory distress.     Breath sounds: No stridor.  Abdominal:     General: There is no distension.   Musculoskeletal:        General: Normal range of motion.     Cervical back: Normal range of motion.   Neurological:     Mental Status: He is alert and oriented to person, place, and time.   Psychiatric:        Behavior: Behavior normal.     ED Results and Treatments Labs (all labs ordered are listed, but only abnormal results are displayed) Labs Reviewed - No data to display                                                                                                                       EKG  EKG Interpretation Date/Time:    Ventricular Rate:    PR Interval:    QRS Duration:    QT Interval:    QTC Calculation:   R Axis:      Text Interpretation:         Radiology No results found.  Medications Ordered in ED Medications  tetracaine (PONTOCAINE) 0.5 % ophthalmic solution 2 drop (has no administration in time range)  fluorescein ophthalmic strip 1 strip (has no administration in time range)   Procedures Procedures  (including critical care time) Medical Decision Making / ED Course   Medical Decision Making Risk  Prescription drug management.    Concern for right eye parasite. Exam is reassuring without parasitic or helminth. Patient did have to rheum that were removed.  He does have an engorged lateral capillary.  No corneal abrasions or ulcers noted.  No other foreign bodies noted upon sweeping of the everted lids.  Eye was irrigated.  Tetracaine drops provided for comfort.  Patient does not appear to be acutely psychotic requiring IVC at this time.  He is cooperative, well mannered and relatively rational.  Aware that this may just be his overactive mind. He feels reassured with the exam and evaluation.    Final Clinical Impression(s) / ED  Diagnoses Final diagnoses:  Eye irritation   The patient appears reasonably screened and/or stabilized for discharge and I doubt any other medical condition or other [facility] requiring further screening, evaluation, or treatment in the ED at this time. I have discussed the findings, Dx and Tx plan with the patient/family who expressed understanding and agree(s) with the plan. Discharge instructions discussed at length. The patient/family was given strict return precautions who verbalized understanding of the instructions. No further questions at time of discharge.  Disposition: Discharge  Condition: Good  ED Discharge Orders     None       Follow Up: Primary care provider  Call  to schedule an appointment for close follow up    This chart was dictated using voice recognition software.  Despite best efforts to proofread,  errors can occur which can change the documentation meaning.    Trine Raynell Moder, MD 11/04/23 [phone]

## 2024-01-01 ENCOUNTER — Ambulatory Visit (HOSPITAL_COMMUNITY)
Admission: EM | Admit: 2024-01-01 | Discharge: 2024-01-03 | Disposition: A | Discharge status: Other Acute Inpatient Hospital | Attending: Psychiatry | Admitting: Psychiatry

## 2024-01-01 DIAGNOSIS — Z91128 Patient's intentional underdosing of medication regimen for other reason: Secondary | ICD-10-CM | POA: Diagnosis not present

## 2024-01-01 DIAGNOSIS — Z79899 Other long term (current) drug therapy: Secondary | ICD-10-CM | POA: Insufficient documentation

## 2024-01-01 DIAGNOSIS — F200 Paranoid schizophrenia: Secondary | ICD-10-CM | POA: Insufficient documentation

## 2024-01-01 DIAGNOSIS — Z91148 Patient's other noncompliance with medication regimen for other reason: Secondary | ICD-10-CM

## 2024-01-01 DIAGNOSIS — I498 Other specified cardiac arrhythmias: Secondary | ICD-10-CM | POA: Diagnosis not present

## 2024-01-01 DIAGNOSIS — T43596A Underdosing of other antipsychotics and neuroleptics, initial encounter: Secondary | ICD-10-CM | POA: Diagnosis not present

## 2024-01-01 DIAGNOSIS — R4689 Other symptoms and signs involving appearance and behavior: Secondary | ICD-10-CM

## 2024-01-01 DIAGNOSIS — F259 Schizoaffective disorder, unspecified: Secondary | ICD-10-CM | POA: Diagnosis present

## 2024-01-01 MED ORDER — DIPHENHYDRAMINE HCL 50 MG PO CAPS: 50.0000 mg | ORAL_CAPSULE | Freq: Three times a day (TID) | ORAL | Status: DC | PRN

## 2024-01-01 MED ORDER — HALOPERIDOL LACTATE 5 MG/ML IJ SOLN: 5.0000 mg | Freq: Three times a day (TID) | INTRAMUSCULAR | Status: DC | PRN

## 2024-01-01 MED ORDER — HALOPERIDOL LACTATE 5 MG/ML IJ SOLN: 10.0000 mg | Freq: Three times a day (TID) | INTRAMUSCULAR | Status: DC | PRN

## 2024-01-01 MED ORDER — DIPHENHYDRAMINE HCL 50 MG/ML IJ SOLN: 50.0000 mg | Freq: Three times a day (TID) | INTRAMUSCULAR | Status: DC | PRN

## 2024-01-01 MED ORDER — LORAZEPAM 2 MG/ML IJ SOLN: 2.0000 mg | Freq: Three times a day (TID) | INTRAMUSCULAR | Status: DC | PRN

## 2024-01-01 MED ORDER — ALUM & MAG HYDROXIDE-SIMETH 200-200-20 MG/5ML PO SUSP: 30.0000 mL | ORAL | Status: DC | PRN

## 2024-01-01 MED ORDER — MAGNESIUM HYDROXIDE 400 MG/5ML PO SUSP: 30.0000 mL | Freq: Every day | ORAL | Status: DC | PRN

## 2024-01-01 MED ORDER — ACETAMINOPHEN 325 MG PO TABS: 650.0000 mg | ORAL_TABLET | Freq: Four times a day (QID) | ORAL | Status: DC | PRN

## 2024-01-01 MED ORDER — HALOPERIDOL 5 MG PO TABS: 5.0000 mg | ORAL_TABLET | Freq: Three times a day (TID) | ORAL | Status: DC | PRN
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
## 2024-01-01 NOTE — ED Provider Notes (Signed)
 [facility]gent [facility] Continuous Assessment Admission H&P  Date: 01/01/24 Patient Name: Zachary Eaton MRN: [id number] Chief Complaint: IVC  Diagnoses:  Final diagnoses:  Schizophrenia, paranoid (HCC)  H/O medication noncompliance  Behavior concern    HPI: Zachary Eaton, 38 y/o mal with a history of schizoaffective disorder presented to [facility]. Via [employer] under IVC, per the IVC 38 year old male with delusional disorder.Fiduciary and guardian is mom name Zachary Eaton :[phone] seen and discussed with NP Leanne.  Patient has guardian (mother) seeing above.  Patient refusing medication and decompensating, the veteran, with a history of schizoaffective disorder bipolar type is currently refusing his long-acting antipsychotic medication (paliperidone ) that previously provide symptom stability.  He is now experiencing psychosis, poor self-care, and has current delusional such as believing he has an amoeba in his brain, receiving command from God, and thinking [facility] staff are poisoning him.  His past refusal of medical patients has led to dangerous behaviors, including homicidal threats, financial loss, poor care for his dogs, and significant substance abuse.  Given his past history of dangerous actions when unmedicated, such as carrying dead dogs in a cooler and drinking unsafe water, riding bikes in the middle of the road, brandishing sword and guns as people, did intervention is necessary to prevent the [name] and those around him.  IVC lasts year 2 months to stabilize.   Face-to-face observation of patient, patient is alert and oriented to person place and time, at first patient is observed sitting on 2 chairs patient was instructed to sit on one chair to prevent him from falling.  Patient became agitated however was easily redirected and calm down.  Patient does however seem to be influenced by internal stimuli as patient goes off in a tantrum saying that he sleep walks sometimes  and he believed that someone is in his house and stealing his things.  Patient continues to say that his mom is the 1 with the problem and she is trying to get out to him.  Patient denies SI, HI, and denies hearing voices or seeing things.  According to him he drank 2 beers today.  Reports he smoked marijuana and tobacco products.  According to him he needs to get his Invega  shot and he is cleared to go.  Writer discussed with patient that he would need to be admitted and reevaluated tomorrow after receiving his Invega  shot.  Recommend observation unit,  with reevaluate patient in the a.m.  Total Time spent with patient: 20 minutes  Musculoskeletal  Strength & Muscle Tone: within normal limits Gait & Station: normal Patient leans: N/A  Psychiatric Specialty Exam  Presentation General Appearance:  Disheveled  Eye Contact: Good  Speech: Clear and Coherent  Speech Volume: Normal  Handedness: Right   Mood and Affect  Mood: Anxious  Affect: Congruent   Thought Process  Thought Processes: Linear  Descriptions of Associations:Loose  Orientation:Full (Time, Place and Person)  Thought Content:Paranoid Ideation  Diagnosis of Schizophrenia or Schizoaffective disorder in past: Yes  Duration of Psychotic Symptoms: Greater than six months  Hallucinations:Hallucinations: None  Ideas of Reference:Paranoia  Suicidal Thoughts:Suicidal Thoughts: No  Homicidal Thoughts:Homicidal Thoughts: No   Sensorium  Memory: Immediate Fair  Judgment: Poor  Insight: Poor   Executive Functions  Concentration: Fair  Attention Span: Fair  Recall: Fair  Fund of Knowledge: Fair  Language: Fair   Psychomotor Activity  Psychomotor Activity: Psychomotor Activity: Normal   Assets  Assets: Desire for Improvement; Resilience; Vocational/Educational   Sleep  Sleep: Sleep: Fair  Number of Hours of Sleep: 6   Nutritional Assessment (For OBS and FBC admissions  only) Has the patient had a weight loss or gain of 10 pounds or more in the last 3 months?: No Has the patient had a decrease in food intake/or appetite?: No Does the patient have dental problems?: No Does the patient have eating habits or behaviors that may be indicators of an eating disorder including binging or inducing vomiting?: No Has the patient recently lost weight without trying?: 0 Has the patient been eating poorly because of a decreased appetite?: 0 Malnutrition Screening Tool Score: 0    Physical Exam HENT:     Head: Normocephalic.     Nose: Nose normal.  Eyes:     Pupils: Pupils are equal, round, and reactive to light.  Cardiovascular:     Rate and Rhythm: Normal rate.  Pulmonary:     Effort: Pulmonary effort is normal.  Musculoskeletal:        General: Normal range of motion.     Cervical back: Normal range of motion.  Neurological:     General: No focal deficit present.     Mental Status: He is alert.  Psychiatric:        Mood and Affect: Mood normal.        Behavior: Behavior normal.        Thought Content: Thought content normal.        Judgment: Judgment normal.    Review of Systems  Constitutional: Negative.   HENT: Negative.    Eyes: Negative.   Respiratory: Negative.    Cardiovascular: Negative.   Gastrointestinal: Negative.   Genitourinary: Negative.   Musculoskeletal: Negative.   Skin: Negative.   Neurological: Negative.   Psychiatric/Behavioral:  The patient is nervous/anxious.     Blood pressure 105/69, pulse 85, temperature 97.9 F (36.6 C), temperature source Oral, resp. rate 20, SpO2 98%. There is no height or weight on file to calculate BMI.  Past Psychiatric History: Schizoaffective disorder, hallucination, bipolar disorder  Is the patient at risk to self? Yes  Has the patient been a risk to self in the past 6 months? No .    Has the patient been a risk to self within the distant past? No   Is the patient a risk to others? No    Has the patient been a risk to others in the past 6 months? No   Has the patient been a risk to others within the distant past? No   Past Medical History: See chart  Family History: Known  Social History: Marijuana  Last Labs:  No visits with results within 6 Month(s) from this visit.  Latest known visit with results is:  Admission on 02/17/2019, Discharged on 03/07/2019  Component Date Value Ref Range Status  . Hgb A1c MFr Bld 02/18/2019 4.9  4.8 - 5.6 % Final   Comment: (NOTE) Pre diabetes:          5.7%-6.4% Diabetes:              >6.4% Glycemic control for   <7.0% adults with diabetes   . Mean Plasma Glucose 02/18/2019 93.93  mg/dL Final   Performed at [facility], [address] [place], [place] [address]  . Cholesterol 02/18/2019 138  0 - 200 mg/dL Final  . Triglycerides 02/18/2019 71  <150 mg/dL Final  . HDL 89/93/7979 44  >40 mg/dL Final  . Total CHOL/HDL Ratio 02/18/2019 3.1  RATIO Final  .  VLDL 02/18/2019 14  0 - 40 mg/dL Final  . LDL Cholesterol 02/18/2019 80  0 - 99 mg/dL Final   Comment:        Total Cholesterol/HDL:CHD Risk Coronary Heart Disease Risk Table                     Men   Women  1/2 Average Risk   3.4   3.3  Average Risk       5.0   4.4  2 X Average Risk   9.6   7.1  3 X Average Risk  23.4   11.0        Use the calculated Patient Ratio above and the CHD Risk Table to determine the patient's CHD Risk.        ATP III CLASSIFICATION (LDL):  <100     mg/dL   Optimal  899-870  mg/dL   Near or Above                    Optimal  130-159  mg/dL   Borderline  839-810  mg/dL   High  >809     mg/dL   Very High Performed at [facility], [address], [place], [place] [address]   . Prolactin 02/18/2019 27.8 (H)  4.0 - 15.2 ng/mL Final   Comment: (NOTE) Performed At: [facility] [address] [place], [place] [id number] Jennette Shorter MD Ey:[id number]   . TSH 02/18/2019 0.717  0.350 - 4.500 uIU/mL Final    Comment: Performed by a 3rd Generation assay with a functional sensitivity of <=0.01 uIU/mL. Performed at [facility], [address], [place], [place] [address]   . SARS Coronavirus 2 02/26/2019 NEGATIVE  NEGATIVE Final   Comment: (NOTE) SARS-CoV-2 target nucleic acids are NOT DETECTED. The SARS-CoV-2 RNA is generally detectable in upper and lower respiratory specimens during the acute phase of infection. Negative results do not preclude SARS-CoV-2 infection, do not rule out co-infections with other pathogens, and should not be used as the sole basis for treatment or other patient management decisions. Negative results must be combined with clinical observations, patient history, and epidemiological information. The expected result is Negative. Fact Sheet for Patients: HairSlick.no Fact Sheet for Healthcare Providers: quierodirigir.com This test is not yet approved or cleared by the [place]  FDA and  has been authorized for detection and/or diagnosis of SARS-CoV-2 by FDA under an Emergency Use Authorization (EUA). This EUA will remain  in effect (meaning this test can be used) for the duration of the COVID-19 declaration under Section 56                          4(b)(1) of the Act, 21 U.S.C. section 360bbb-3(b)(1), unless the authorization is terminated or revoked sooner. Performed at [facility], [address] [place], [place] [address]   . Lithium  Lvl 03/02/2019 0.36 (L)  0.60 - 1.20 mmol/L Final   Performed at [facility], [address] [place], [place] [address]  . Valproic Acid  Lvl 03/02/2019 45 (L)  50.0 - 100.0 ug/mL Final   Performed at [facility], [address] [place], [place] [address]  . WBC 03/02/2019 8.8  4.0 - 10.5 K/uL Final  . RBC 03/02/2019 4.94  4.22 - 5.81 MIL/uL Final  . Hemoglobin 03/02/2019 14.6  13.0 - 17.0 g/dL Final  . HCT  89/81/7979 46.1  39.0 - 52.0 % Final  . MCV 03/02/2019 93.3  80.0 - 100.0 fL Final  . MCH 03/02/2019 29.6  26.0 - 34.0 pg Final  . MCHC 03/02/2019 31.7  30.0 - 36.0 g/dL Final  . RDW 89/81/7979 11.9  11.5 - 15.5 % Final  . Platelets 03/02/2019 222  150 - 400 K/uL Final  . nRBC 03/02/2019 0.0  0.0 - 0.2 % Final  . Neutrophils Relative % 03/02/2019 63  % Final  . Neutro Abs 03/02/2019 5.6  1.7 - 7.7 K/uL Final  . Lymphocytes Relative 03/02/2019 25  % Final  . Lymphs Abs 03/02/2019 2.2  0.7 - 4.0 K/uL Final  . Monocytes Relative 03/02/2019 9  % Final  . Monocytes Absolute 03/02/2019 0.8  0.1 - 1.0 K/uL Final  . Eosinophils Relative 03/02/2019 1  % Final  . Eosinophils Absolute 03/02/2019 0.1  0.0 - 0.5 K/uL Final  . Basophils Relative 03/02/2019 1  % Final  . Basophils Absolute 03/02/2019 0.1  0.0 - 0.1 K/uL Final  . Immature Granulocytes 03/02/2019 1  % Final  . Abs Immature Granulocytes 03/02/2019 0.06  0.00 - 0.07 K/uL Final   Performed at [facility], [address], [place], [place] [address]  . Total Protein 03/02/2019 7.7  6.5 - 8.1 g/dL Final  . Albumin 89/81/7979 4.4  3.5 - 5.0 g/dL Final  . AST 89/81/7979 41  15 - 41 U/L Final  . ALT 03/02/2019 64 (H)  0 - 44 U/L Final  . Alkaline Phosphatase 03/02/2019 47  38 - 126 U/L Final  . Total Bilirubin 03/02/2019 0.6  0.3 - 1.2 mg/dL Final  . Bilirubin, Direct 03/02/2019 <0.1  0.0 - 0.2 mg/dL Final  . Indirect Bilirubin 03/02/2019 NOT CALCULATED  0.3 - 0.9 mg/dL Final   Performed at [facility], [address] [place], [place] [address]  . Total Protein 03/03/2019 7.7  6.5 - 8.1 g/dL Final  . Albumin 89/80/7979 4.6  3.5 - 5.0 g/dL Final  . AST 89/80/7979 37  15 - 41 U/L Final  . ALT 03/03/2019 59 (H)  0 - 44 U/L Final  . Alkaline Phosphatase 03/03/2019 46  38 - 126 U/L Final  . Total Bilirubin 03/03/2019 0.2 (L)  0.3 - 1.2 mg/dL Final  . Bilirubin, Direct 03/03/2019 0.1  0.0 - 0.2 mg/dL Final   . Indirect Bilirubin 03/03/2019 0.1 (L)  0.3 - 0.9 mg/dL Final   Performed at [facility], [address] [place], [place] [address]    Allergies: Penicillins  Medications:  PTA Medications  Medication Sig  . omega-3 acid ethyl esters (LOVAZA ) 1 g capsule Take 1 capsule (1 g total) by mouth 2 (two) times daily. (Patient not taking: Reported on 02/18/2019)  . divalproex  (DEPAKOTE ) 250 MG DR tablet Take 1 tablet (250 mg) by mouth in the morning & 2 tablets (500 mg) at bedtime: For mood stabilization  . lithium  carbonate 150 MG capsule Take 3 capsules (450 mg total) by mouth at bedtime. For mood stabilization  . nicotine  polacrilex (NICORETTE ) 2 MG gum Take 1 each (2 mg total) by mouth as needed. (May buy from over the counter): For smoking cessation  . paliperidone  (INVEGA  SUSTENNA) 234 MG/1.5ML SUSY injection Inject 234 mg into the muscle once for 1 dose. (Due on 03-18-19): For mood control  . risperiDONE  (RISPERDAL ) 3 MG tablet Take 2 tablets (6 mg total) by mouth at bedtime. For mood control  . sodium  chloride (OCEAN) 0.65 % SOLN nasal spray Place 1 spray into both nostrils as needed for congestion.  . temazepam  (RESTORIL ) 30 MG capsule Take 1 capsule (30 mg total) by mouth at bedtime. For sleep  . diphenhydrAMINE  (BENADRYL ) 25 MG tablet Take 1 tablet (25 mg total) by mouth every 6 (six) hours as needed for allergies.      Medical Decision Making  Observation unit    Recommendations  Based on my evaluation the patient does not appear to have an emergency medical condition.  [name], NP 01/01/24  10:32 PM

## 2024-01-01 NOTE — Progress Notes (Signed)
   01/01/24 2119  [facility] Triage Screening (Walk-ins at [facility] only)  How Did You Hear About Us ? Legal System  What Is the Reason for Your Visit/Call Today? Pt presents to [facility] under IVC, accompanied by [employer]. Pt is uncertain why he is here at [facility]. Pt reports that he had an appointment at the [facility] and he refused his Invega  shot. Per IVC- Pt refusing medication and decompensating. The veteran, with history of Schizoaffective Disorder bipolar type, is currently refusing his long acting antipsychotic medication (paliperidone ) that previously provided symptom stability. He is now experiencing psychosis, poor self-care, and has current delusions such as believing he has an amoeba in his brain, receiving commands from God, and thinking [facility] staff are poisoning him. His past refusal of medication has led to dangerous behaviors, including homicidal threats, financial loss, poor care of his dogs and significant drug use. Given his past history of dangerous actions when unmedicated-such as carrying dead dogs in a cooler and drinking unsafe water, riding his bike in the middle of the road, brandishing swords and guns as people-immediate intervention is necessary to protect the veteran and those around him. IVC last year for two months to stabilize. Pt currently denies SI,HI,AVH and substance/alcohol use.  How Long Has This Been Causing You Problems? <Week  Have You Recently Had Any Thoughts About Hurting Yourself? No  Are You Planning to Commit Suicide/Harm Yourself At This time? No  Have you Recently Had Thoughts About Hurting Someone Sherral? No  Are You Planning To Harm Someone At This Time? No  Physical Abuse Denies  Verbal Abuse Denies  Sexual Abuse Denies  Exploitation of patient/patient's resources Denies  Self-Neglect Denies  Are you currently experiencing any auditory, visual or other hallucinations? No  Have You Used Any Alcohol or Drugs in the Past 24 Hours? No  Do you have any current medical co-morbidities  that require immediate attention? No  Clinician description of patient physical appearance/behavior: pt is calm, cooperative, casually dressed  What Do You Feel Would Help You the Most Today? Treatment for Depression or other mood problem  If access to [facility] was not available, would you have sought care in the Emergency Department? No  Determination of Need Urgent (48 hours)  Options For Referral Other: Comment;BH Urgent Care;Outpatient Therapy;Medication Management;Inpatient Hospitalization  Determination of Need filed? Yes

## 2024-01-02 DIAGNOSIS — Z91148 Patient's other noncompliance with medication regimen for other reason: Secondary | ICD-10-CM

## 2024-01-02 LAB — COMPREHENSIVE METABOLIC PANEL WITH GFR
ALT: 30 U/L (ref 0–44)
AST: 23 U/L (ref 15–41)
Albumin: 4.7 g/dL (ref 3.5–5.0)
Alkaline Phosphatase: 44 U/L (ref 38–126)
Anion gap: 13 (ref 5–15)
BUN: 12 mg/dL (ref 6–20)
CO2: 25 mmol/L (ref 22–32)
Calcium: 10.1 mg/dL (ref 8.9–10.3)
Chloride: 102 mmol/L (ref 98–111)
Creatinine, Ser: 0.97 mg/dL (ref 0.61–1.24)
GFR, Estimated: >60 mL/min (ref >60)
Glucose, Bld: 77 mg/dL (ref 70–99)
Potassium: 4.7 mmol/L (ref 3.5–5.1)
Sodium: 140 mmol/L (ref 135–145)
Total Bilirubin: 0.5 mg/dL (ref 0.0–1.2)
Total Protein: 7.7 g/dL (ref 6.5–8.1)

## 2024-01-02 LAB — CBC WITH DIFFERENTIAL/PLATELET
Abs Immature Granulocytes: 0.03 K/uL (ref 0.00–0.07)
Basophils Absolute: 0.1 K/uL (ref 0.0–0.1)
Basophils Relative: 0 %
Eosinophils Absolute: 0.1 K/uL (ref 0.0–0.5)
Eosinophils Relative: 1 %
HCT: 47.7 % (ref 39.0–52.0)
Hemoglobin: 15.6 g/dL (ref 13.0–17.0)
Immature Granulocytes: 0 %
Lymphocytes Relative: 20 %
Lymphs Abs: 2.4 K/uL (ref 0.7–4.0)
MCH: 29.7 pg (ref 26.0–34.0)
MCHC: 32.7 g/dL (ref 30.0–36.0)
MCV: 90.9 fL (ref 80.0–100.0)
Monocytes Absolute: 0.7 K/uL (ref 0.1–1.0)
Monocytes Relative: 6 %
Neutro Abs: 8.8 K/uL — ABNORMAL HIGH (ref 1.7–7.7)
Neutrophils Relative %: 73 %
Platelets: 286 K/uL (ref 150–400)
RBC: 5.25 MIL/uL (ref 4.22–5.81)
RDW: 12.9 % (ref 11.5–15.5)
WBC: 12.1 K/uL — ABNORMAL HIGH (ref 4.0–10.5)
nRBC: 0 % (ref 0.0–0.2)

## 2024-01-02 LAB — TSH: TSH: 1.008 u[IU]/mL (ref 0.350–4.500)

## 2024-01-02 LAB — ETHANOL: Alcohol, Ethyl (B): <15 mg/dL (ref <15)

## 2024-01-02 MED ORDER — OLANZAPINE 5 MG PO TBDP
5.0000 mg | ORAL_TABLET | Freq: Two times a day (BID) | ORAL | Status: DC
  Administered 2024-01-02: 5 mg via ORAL
  Filled 2024-01-02 (×3): qty 1

## 2024-01-02 MED ORDER — NICOTINE POLACRILEX 2 MG MT GUM
2.0000 mg | CHEWING_GUM | OROMUCOSAL | Status: DC
  Administered 2024-01-02 – 2024-01-03 (×3): 2 mg via ORAL
  Filled 2024-01-02 (×3): qty 1

## 2024-01-02 MED ORDER — HYDROXYZINE HCL 25 MG PO TABS
25.0000 mg | ORAL_TABLET | Freq: Three times a day (TID) | ORAL | Status: DC | PRN
  Filled 2024-01-02 (×2): qty 1

## 2024-01-02 NOTE — ED Provider Notes (Signed)
 Provider spoke with patient and informed him of continuation of IVC and recommendation for inpatient. Patient appeared irritable, continuously stating I just need to go home! I need to take care of my babies. Informed patient that mother was willing to care for dogs and requested his keys for access to his home, to which patient declined stating that he was the only person able to care for them. Patient stated the [facility] and his mother were lying about his statements and were conspiring against him. Writer provided emotional support and again explained reason for admission.
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
## 2024-01-02 NOTE — ED Notes (Signed)
 Zachary Eaton arrived to the [facility] for observation. Patient is A&Ox4. Patient states what brought them into the facility is that [place] was upset that I did not get the shot. Patient denies suicidal ideations or homicidal ideations. Pt contracts for safety. Patient denies auditory hallucinations, visual hallucinations, or CAH. Patient oriented to the unit and provided food, beverage, and snack. Patient verbalized understanding. Patient currently resting in bed. No distress noted.

## 2024-01-02 NOTE — ED Notes (Signed)
 Pt observed/assessed in recliner sleeping. RR even and unlabored, appearing in no noted distress. Environmental check complete, will continue to monitor for safety

## 2024-01-02 NOTE — ED Notes (Signed)
 Pt refused lunch. Pt also refused schedule dose of zyprexa , stating that he didn't need it, pt denies having any symptoms of hallucinations, delusions, or mood problems. Pt generally guarded, no aggression displayed toward staff or observed toward other pts thus far.

## 2024-01-02 NOTE — Progress Notes (Signed)
 LCSW Progress Note  [id number]   Zachary Eaton  01/02/2024  11:41 AM  Description:   Inpatient Psychiatric Referral  Patient was recommended inpatient per [name] NP. There are no available beds at [facility], per [facility] Cherylynn Ernst RN Patient was referred to the following out of network facilities:    [facility] Provider Address Phone Fax  [facility]  [address], [place] [address] [phone]  [facility]  [address] [place] [address] [phone]  [facility]  [address] [place] [address] [phone]  [facility]  [address] [place] [address] [phone]  [facility]  [address], [place] [address] [phone]  [facility] EFAX  [address] [place] [phone]  [facility]  [address] [place] [address] [phone]  [facility]  [address], [place] [address] [phone]      Situation ongoing, CSW to continue following and update chart as more information becomes available.     Zachary Eaton, MSW, LCSW  01/02/2024 11:41 AM

## 2024-01-02 NOTE — ED Notes (Addendum)
 Pt reports desire to discharge. Pt generally cooperative but seems somewhat irritable. Pt denies anxiety, si and hi. Pt declined breakfast. Pt denies physical pain and discomfort.

## 2024-01-02 NOTE — Discharge Instructions (Addendum)
 Patient transferred to [facility] for inpatient treatment.

## 2024-01-02 NOTE — Progress Notes (Signed)
 Report called to Charlet Birmingham, RN [facility]. Sheriff was also called to transport and Clinical research associate was informed that they Management consultant) will call back if able to transport today otherwise pt will be transported tomorrow.

## 2024-01-02 NOTE — ED Provider Notes (Signed)
 Writer called by front dest staff stating that pt's mother was a different [place] trying to drop off his medication.  Writer went, I met with patient's mother, who states that she was at the [facility] today to pick up Invega  Sustenna as per patient's provider's request from earlier today.  Mother states that historically, Abilify  has helped patient better than Invega , but at I had to do my part to go pick up the Invega .  Mother states that patient stopped taking Abilify  maintaina because he had hives and blamed it on the Invega , but she does not feel as though Invega  caused them.  Patient's mother states that she was told earlier today that patient was willing to be transferred to [facility], but states that she does not want patient to be transferred there, and that she prefers for patient to be transferred to the [place] where he is historically treated.  Mother would like to be called prior to patient being transferred prior to an inpatient facility.  Patient's mother dropped out of the Invega , with security here at [facility], who states that they will sore it in the medication room, and if not administered to patient, it should be returned to patient's mother.

## 2024-01-02 NOTE — ED Notes (Signed)
 Patient currently sleeping and resting in recliner. RR even and unlabored, appearing in no noted distress. Environmental check complete

## 2024-01-02 NOTE — Progress Notes (Signed)
 Pt has been accepted to [facility] TODAY 08/20/202 Bed assignment: Main campus  Pt meets inpatient criteria per: [name] NP  Attending Physician will be Millie Manners, MD  Report can be called to: [phone] (this is a pager, please leave call-back number when giving report)  Pt can arrive after ASAP   Care Team Notified: Tinnie Cara RN, Rolin Lipps NP  [place] Jamiyah Dingley LCSW-A   01/02/2024 2:41 PM

## 2024-01-03 MED ORDER — NICOTINE POLACRILEX 2 MG MT GUM: 2.0000 mg | CHEWING_GUM | Freq: Once | OROMUCOSAL | Status: DC

## 2024-01-03 NOTE — ED Notes (Signed)
 Patient A&Ox4. Denies intent to harm self/others when asked. Denies A/VH. States, Im not taking your meds. I'm trapped here but I will see my lawyer when I get home, no disrespect. No acute distress noted. Support and encouragement provided. Routine safety checks conducted according to facility protocol. Encouraged patient to notify staff if thoughts of harm toward self or others arise. Patient verbalize understanding and agreement. Will continue to monitor for safety.

## 2024-01-03 NOTE — ED Notes (Signed)
 Report called to [place] [facility]. Sheriff here for transport to [facility]. Belongings given to sheriff transport. Patient escorted to [place] via staff for transport to destination. Safety maintained.
